# Patient Record
Sex: Male | Born: 1935 | Race: White | Hispanic: No | Marital: Married
Health system: Southern US, Community
[De-identification: ages and names within clinical notes are randomized; demographics above are authoritative.]

## PROBLEM LIST (undated history)

## (undated) DIAGNOSIS — A419 Sepsis, unspecified organism: Secondary | ICD-10-CM

## (undated) DIAGNOSIS — K7030 Alcoholic cirrhosis of liver without ascites: Secondary | ICD-10-CM

## (undated) DIAGNOSIS — J9621 Acute and chronic respiratory failure with hypoxia: Secondary | ICD-10-CM

## (undated) DIAGNOSIS — E2740 Unspecified adrenocortical insufficiency: Secondary | ICD-10-CM

## (undated) DIAGNOSIS — F0390 Unspecified dementia without behavioral disturbance: Secondary | ICD-10-CM

## (undated) DIAGNOSIS — J90 Pleural effusion, not elsewhere classified: Secondary | ICD-10-CM

## (undated) DIAGNOSIS — R6520 Severe sepsis without septic shock: Secondary | ICD-10-CM

---

## 2018-08-09 DEATH — deceased

## 2019-07-02 ENCOUNTER — Other Ambulatory Visit (HOSPITAL_COMMUNITY): Payer: Medicare Other

## 2019-07-02 ENCOUNTER — Inpatient Hospital Stay
Admission: AD | Admit: 2019-07-02 | Discharge: 2019-08-10 | Disposition: E | Payer: Medicare Other | Source: Other Acute Inpatient Hospital | Attending: Internal Medicine | Admitting: Internal Medicine

## 2019-07-02 DIAGNOSIS — K7030 Alcoholic cirrhosis of liver without ascites: Secondary | ICD-10-CM | POA: Diagnosis present

## 2019-07-02 DIAGNOSIS — J9621 Acute and chronic respiratory failure with hypoxia: Secondary | ICD-10-CM | POA: Diagnosis present

## 2019-07-02 DIAGNOSIS — R188 Other ascites: Secondary | ICD-10-CM

## 2019-07-02 DIAGNOSIS — F0390 Unspecified dementia without behavioral disturbance: Secondary | ICD-10-CM | POA: Diagnosis present

## 2019-07-02 DIAGNOSIS — E2740 Unspecified adrenocortical insufficiency: Secondary | ICD-10-CM | POA: Diagnosis present

## 2019-07-02 DIAGNOSIS — R34 Anuria and oliguria: Secondary | ICD-10-CM

## 2019-07-02 DIAGNOSIS — J189 Pneumonia, unspecified organism: Secondary | ICD-10-CM

## 2019-07-02 DIAGNOSIS — J111 Influenza due to unidentified influenza virus with other respiratory manifestations: Secondary | ICD-10-CM

## 2019-07-02 DIAGNOSIS — Z9289 Personal history of other medical treatment: Secondary | ICD-10-CM

## 2019-07-02 DIAGNOSIS — R0603 Acute respiratory distress: Secondary | ICD-10-CM

## 2019-07-02 DIAGNOSIS — Z0189 Encounter for other specified special examinations: Secondary | ICD-10-CM

## 2019-07-02 DIAGNOSIS — Z9911 Dependence on respirator [ventilator] status: Secondary | ICD-10-CM

## 2019-07-02 DIAGNOSIS — J9690 Respiratory failure, unspecified, unspecified whether with hypoxia or hypercapnia: Secondary | ICD-10-CM

## 2019-07-02 DIAGNOSIS — A419 Sepsis, unspecified organism: Secondary | ICD-10-CM | POA: Diagnosis present

## 2019-07-02 DIAGNOSIS — J90 Pleural effusion, not elsewhere classified: Secondary | ICD-10-CM | POA: Diagnosis present

## 2019-07-02 DIAGNOSIS — Z4659 Encounter for fitting and adjustment of other gastrointestinal appliance and device: Secondary | ICD-10-CM

## 2019-07-02 DIAGNOSIS — J9811 Atelectasis: Secondary | ICD-10-CM

## 2019-07-02 DIAGNOSIS — K7460 Unspecified cirrhosis of liver: Secondary | ICD-10-CM

## 2019-07-02 HISTORY — DX: Alcoholic cirrhosis of liver without ascites: K70.30

## 2019-07-02 HISTORY — DX: Pleural effusion, not elsewhere classified: J90

## 2019-07-02 HISTORY — DX: Unspecified adrenocortical insufficiency: E27.40

## 2019-07-02 HISTORY — DX: Sepsis, unspecified organism: R65.20

## 2019-07-02 HISTORY — DX: Acute and chronic respiratory failure with hypoxia: J96.21

## 2019-07-02 HISTORY — DX: Sepsis, unspecified organism: A41.9

## 2019-07-02 HISTORY — DX: Unspecified dementia, unspecified severity, without behavioral disturbance, psychotic disturbance, mood disturbance, and anxiety: F03.90

## 2019-07-02 LAB — BLOOD GAS, ARTERIAL
Acid-Base Excess: 4.1 mmol/L — ABNORMAL HIGH (ref 0.0–2.0)
Bicarbonate: 27.4 mmol/L (ref 20.0–28.0)
FIO2: 35
O2 Saturation: 98 %
Patient temperature: 37
pCO2 arterial: 36.3 mmHg (ref 32.0–48.0)
pH, Arterial: 7.49 — ABNORMAL HIGH (ref 7.350–7.450)
pO2, Arterial: 98.3 mmHg (ref 83.0–108.0)

## 2019-07-02 MED ORDER — IVERMECTIN 3 MG PO TABS
200.00 | ORAL_TABLET | ORAL | Status: DC
Start: ? — End: 2019-07-02

## 2019-07-02 MED ORDER — DEXTROSE 10 % IV SOLN
50.00 | INTRAVENOUS | Status: DC
Start: ? — End: 2019-07-02

## 2019-07-02 MED ORDER — TAMSULOSIN HCL 0.4 MG PO CAPS
0.80 | ORAL_CAPSULE | ORAL | Status: DC
Start: 2019-07-02 — End: 2019-07-02

## 2019-07-02 MED ORDER — DEXTROSE 50 % IV SOLN
50.00 | INTRAVENOUS | Status: DC
Start: ? — End: 2019-07-02

## 2019-07-02 MED ORDER — MORPHINE SULFATE 2 MG/ML IJ SOLN
2.00 | INTRAMUSCULAR | Status: DC
Start: ? — End: 2019-07-02

## 2019-07-02 MED ORDER — SENNOSIDES-DOCUSATE SODIUM 8.6-50 MG PO TABS
1.00 | ORAL_TABLET | ORAL | Status: DC
Start: 2019-07-02 — End: 2019-07-02

## 2019-07-02 MED ORDER — GENERIC EXTERNAL MEDICATION
1.00 | Status: DC
Start: 2019-07-02 — End: 2019-07-02

## 2019-07-02 MED ORDER — COCONUT OIL OIL
1.00 | TOPICAL_OIL | Status: DC
Start: 2019-07-02 — End: 2019-07-02

## 2019-07-02 MED ORDER — ASPIRIN 81 MG PO CHEW
81.00 | CHEWABLE_TABLET | ORAL | Status: DC
Start: 2019-07-03 — End: 2019-07-02

## 2019-07-02 MED ORDER — HYDROCORTISONE NA SUCCINATE PF 100 MG IJ SOLR
100.00 | INTRAMUSCULAR | Status: DC
Start: 2019-07-02 — End: 2019-07-02

## 2019-07-02 MED ORDER — ALBUTEROL SULFATE (2.5 MG/3ML) 0.083% IN NEBU
2.50 | INHALATION_SOLUTION | RESPIRATORY_TRACT | Status: DC
Start: ? — End: 2019-07-02

## 2019-07-02 MED ORDER — ACETYLCYSTEINE 20 % IN SOLN
600.00 | RESPIRATORY_TRACT | Status: DC
Start: 2019-07-02 — End: 2019-07-02

## 2019-07-02 MED ORDER — LACTULOSE 10 GM/15ML PO SOLN
20.00 | ORAL | Status: DC
Start: 2019-07-02 — End: 2019-07-02

## 2019-07-02 MED ORDER — PANTOPRAZOLE SODIUM 40 MG IV SOLR
40.00 | INTRAVENOUS | Status: DC
Start: 2019-07-03 — End: 2019-07-02

## 2019-07-03 DIAGNOSIS — K7030 Alcoholic cirrhosis of liver without ascites: Secondary | ICD-10-CM | POA: Diagnosis not present

## 2019-07-03 DIAGNOSIS — E2740 Unspecified adrenocortical insufficiency: Secondary | ICD-10-CM

## 2019-07-03 DIAGNOSIS — R6520 Severe sepsis without septic shock: Secondary | ICD-10-CM

## 2019-07-03 DIAGNOSIS — Z9911 Dependence on respirator [ventilator] status: Secondary | ICD-10-CM

## 2019-07-03 DIAGNOSIS — A419 Sepsis, unspecified organism: Secondary | ICD-10-CM

## 2019-07-03 DIAGNOSIS — F0390 Unspecified dementia without behavioral disturbance: Secondary | ICD-10-CM

## 2019-07-03 DIAGNOSIS — J90 Pleural effusion, not elsewhere classified: Secondary | ICD-10-CM | POA: Diagnosis not present

## 2019-07-03 DIAGNOSIS — J9621 Acute and chronic respiratory failure with hypoxia: Secondary | ICD-10-CM | POA: Diagnosis not present

## 2019-07-03 LAB — CBC WITH DIFFERENTIAL/PLATELET
Abs Immature Granulocytes: 0.12 10*3/uL — ABNORMAL HIGH (ref 0.00–0.07)
Basophils Absolute: 0 10*3/uL (ref 0.0–0.1)
Basophils Relative: 0 %
Eosinophils Absolute: 0.1 10*3/uL (ref 0.0–0.5)
Eosinophils Relative: 2 %
HCT: 33.6 % — ABNORMAL LOW (ref 39.0–52.0)
Hemoglobin: 10.8 g/dL — ABNORMAL LOW (ref 13.0–17.0)
Immature Granulocytes: 2 %
Lymphocytes Relative: 9 %
Lymphs Abs: 0.6 10*3/uL — ABNORMAL LOW (ref 0.7–4.0)
MCH: 30.9 pg (ref 26.0–34.0)
MCHC: 32.1 g/dL (ref 30.0–36.0)
MCV: 96 fL (ref 80.0–100.0)
Monocytes Absolute: 0.4 10*3/uL (ref 0.1–1.0)
Monocytes Relative: 6 %
Neutro Abs: 5.7 10*3/uL (ref 1.7–7.7)
Neutrophils Relative %: 81 %
Platelets: 193 10*3/uL (ref 150–400)
RBC: 3.5 MIL/uL — ABNORMAL LOW (ref 4.22–5.81)
RDW: 17.4 % — ABNORMAL HIGH (ref 11.5–15.5)
WBC: 7 10*3/uL (ref 4.0–10.5)
nRBC: 0 % (ref 0.0–0.2)

## 2019-07-03 LAB — HEMOGLOBIN A1C
Hgb A1c MFr Bld: 4.5 % — ABNORMAL LOW (ref 4.8–5.6)
Mean Plasma Glucose: 82.45 mg/dL

## 2019-07-03 LAB — URINALYSIS, ROUTINE W REFLEX MICROSCOPIC
Bilirubin Urine: NEGATIVE
Glucose, UA: NEGATIVE mg/dL
Ketones, ur: NEGATIVE mg/dL
Nitrite: NEGATIVE
Protein, ur: 100 mg/dL — AB
Specific Gravity, Urine: 1.024 (ref 1.005–1.030)
pH: 5 (ref 5.0–8.0)

## 2019-07-03 LAB — COMPREHENSIVE METABOLIC PANEL
ALT: 100 U/L — ABNORMAL HIGH (ref 0–44)
AST: 78 U/L — ABNORMAL HIGH (ref 15–41)
Albumin: 1.8 g/dL — ABNORMAL LOW (ref 3.5–5.0)
Alkaline Phosphatase: 125 U/L (ref 38–126)
Anion gap: 8 (ref 5–15)
BUN: 28 mg/dL — ABNORMAL HIGH (ref 8–23)
CO2: 28 mmol/L (ref 22–32)
Calcium: 8 mg/dL — ABNORMAL LOW (ref 8.9–10.3)
Chloride: 113 mmol/L — ABNORMAL HIGH (ref 98–111)
Creatinine, Ser: 0.89 mg/dL (ref 0.61–1.24)
GFR calc Af Amer: >60 mL/min (ref >60)
GFR calc non Af Amer: >60 mL/min (ref >60)
Glucose, Bld: 146 mg/dL — ABNORMAL HIGH (ref 70–99)
Potassium: 3.8 mmol/L (ref 3.5–5.1)
Sodium: 149 mmol/L — ABNORMAL HIGH (ref 135–145)
Total Bilirubin: 1 mg/dL (ref 0.3–1.2)
Total Protein: 4.2 g/dL — ABNORMAL LOW (ref 6.5–8.1)

## 2019-07-03 LAB — PHOSPHORUS: Phosphorus: 2.1 mg/dL — ABNORMAL LOW (ref 2.5–4.6)

## 2019-07-03 LAB — PROTIME-INR
INR: 1.2 (ref 0.8–1.2)
Prothrombin Time: 15.4 seconds — ABNORMAL HIGH (ref 11.4–15.2)

## 2019-07-03 LAB — TSH: TSH: 4.561 u[IU]/mL — ABNORMAL HIGH (ref 0.350–4.500)

## 2019-07-03 LAB — MAGNESIUM: Magnesium: 2 mg/dL (ref 1.7–2.4)

## 2019-07-03 NOTE — Consult Note (Signed)
Pulmonary Critical Care Medicine [facility]  PULMONARY SERVICE  Date of Service: 07/03/2019  PULMONARY CRITICAL CARE CONSULT   Joel Ruiz  CVE:[id number]  DOB: 03-27-1932   DOA: 06/13/2019  Referring Physician: Merton Border, MD  HPI: Joel Ruiz is a 85 y.o. male seen for follow up of Acute on Chronic Respiratory Failure.  Patient has multiple medical problems presented to the hospital with history of myocardial infarction peripheral vascular disease hyperlipidemia hypertension came into the hospital because of increasing shortness of breath.  Apparently was noted to have saturations in the 80s and generalized weakness.  Patient apparently had not been seen by a physician in quite some time.  Was found to be septic patient apparently also had pleural effusion and underwent thoracentesis which was negative for empyema but patient did have a lower lobe pneumonia and was treated with aggressive antibiotics.  Apparently had skin biopsy for possibility of scabies.  Subsequently after antibiotics were given the patient became afebrile however had other complications included development of heart block and was given dopamine for the low heart rate.  Mental status changes were noted for which the patient was evaluated.  Subsequently transferred to our facility for further management.  It should be also noted that he had a Cortrosyn stim test done which was suggestive of adrenal insufficiency.  This is likely why he continues to hypothermia according to the notes.  Review of Systems:  ROS performed and is unremarkable other than noted above.  Medical Hx: Past Medical History:  Diagnosis Date  . Hyperlipidemia  . Hypertension   Surgical Hx: Past Surgical History:  Procedure Laterality Date  . HERNIA REPAIR  Cholecystectomy for cholelithiasis and common bile duct stone  Allergies:  Ativan [lorazepam]  Social Hx:  Tobacco use: reports that he has never  smoked. He has never used smokeless tobacco. Alcohol use: Reports using 6 ounces (3 shots) of 80 proof liquor and a drink nightly.  Drug use: reports no history of drug use.  Lives at home with his daughter, Garald Balding.  Family History: Family History  Problem Relation Age of Onset  . Hyperlipidemia Mother    Medications: Reviewed on Rounds  Physical Exam:  Vitals: Temperature 97.0 pulse 70 respiratory 16 blood pressure 174/77 saturations 100%  Ventilator Settings mode of ventilation assist control FiO2 35% tidal volume 450 PEEP 5  . General: Comfortable at this time . Eyes: Grossly normal lids, irises & conjunctiva . ENT: grossly tongue is normal . Neck: no obvious mass . Cardiovascular: S1-S2 normal no gallop or rub . Respiratory: No rhonchi no rales are noted at this time . Abdomen: Soft nontender . Skin: no rash seen on limited exam . Musculoskeletal: not rigid . Psychiatric:unable to assess . Neurologic: no seizure no involuntary movements         Labs on Admission:  Basic Metabolic Panel: Recent Labs  Lab 07/03/19 0358  NA 149*  K 3.8  CL 113*  CO2 28  GLUCOSE 146*  BUN 28*  CREATININE 0.89  CALCIUM 8.0*  MG 2.0  PHOS 2.1*    Recent Labs  Lab 06/09/2019 1625  PHART 7.490*  PCO2ART 36.3  PO2ART 98.3  HCO3 27.4  O2SAT 98.0    Liver Function Tests: Recent Labs  Lab 07/03/19 0358  AST 78*  ALT 100*  ALKPHOS 125  BILITOT 1.0  PROT 4.2*  ALBUMIN 1.8*   No results for input(s): LIPASE, AMYLASE in the last 168 hours. No results for  input(s): AMMONIA in the last 168 hours.  CBC: Recent Labs  Lab 07/03/19 0358  WBC 7.0  NEUTROABS 5.7  HGB 10.8*  HCT 33.6*  MCV 96.0  PLT 193    Cardiac Enzymes: No results for input(s): CKTOTAL, CKMB, CKMBINDEX, TROPONINI in the last 168 hours.  BNP (last 3 results) No results for input(s): BNP in the last 8760 hours.  ProBNP (last 3 results) No results for input(s): PROBNP in the last 8760  hours.   Radiological Exams on Admission: DG Abd 1 View  Result Date: 06/24/2019 CLINICAL DATA:  Orogastric tube placement. EXAM: ABDOMEN - 1 VIEW COMPARISON:  None. FINDINGS: 1639 hours. An enteric tube projects below the diaphragm, tip in the left epigastric region, consistent with position in the mid stomach. The visualized bowel gas pattern is normal. Telemetry leads overlie the chest and abdomen. The lower chest findings are dictated separately. IMPRESSION: Enteric tube tip in the mid stomach. No acute abdominal findings demonstrated on this single supine view. Electronically Signed   By: Carey Bullocks M.D.   On: 06/14/2019 17:18   DG Chest Port 1 View  Result Date: 07/06/2019 CLINICAL DATA:  Ventilator dependent. Intubated. EXAM: PORTABLE CHEST 1 VIEW COMPARISON:  None. FINDINGS: 1638 hours. Endotracheal tube tip is in the mid trachea. A left IJ central venous catheter projects to the level the superior cavoatrial junction. Enteric tube projects below the diaphragm. There are moderate bilateral pleural effusions with associated bibasilar pulmonary opacities. No edema or pneumothorax. The heart size and mediastinal contours are normal. There is mild aortic atherosclerosis. No osseous abnormalities are seen. IMPRESSION: 1. Satisfactory position of the support system. 2. Bilateral pleural effusions with bibasilar pulmonary opacities consistent with atelectasis or infiltrates. Electronically Signed   By: Carey Bullocks M.D.   On: 06/22/2019 17:19    Assessment/Plan Active Problems:   Acute on chronic respiratory failure with hypoxia (HCC)   Severe sepsis (HCC)   Alcoholic cirrhosis of liver (HCC)   Adrenal insufficiency (HCC)   Dementia (HCC)   Bilateral pleural effusion   1. Acute on chronic respiratory failure with hypoxia patient remains orally intubated on the ventilator at this time.  Patient is on pressure support goal is 2 hours we will continue to build on advancing on the wean  pressure support mode so far he looks like he is doing fairly well. 2. Severe sepsis patient was thought to have bilateral pneumonia which was responsible for his sepsis picture treated with antibiotics apparently is on meropenem for this 3. Adrenal insufficiency at the other facility had a Cortrosyn stim test which showed borderline adrenal insufficiency plan is going to be to monitor closely. 4. Dementia no change we will monitor for any behavioral issues 5. Alcoholic cirrhosis by history we will continue with supportive care no active withdrawal 6. Pleural effusion patient treated at the other facility for complicated pleural effusion not an empyema we will continue to monitor closely.  I have personally seen and evaluated the patient, evaluated laboratory and imaging results, formulated the assessment and plan and placed orders. The Patient requires high complexity decision making for assessment and support.  Case was discussed on Rounds with the Respiratory Therapy Staff Time Spent  Yevonne Pax, MD [facility] Pulmonary Critical Care Medicine Sleep Medicine

## 2019-07-04 ENCOUNTER — Encounter: Payer: Self-pay | Admitting: Internal Medicine

## 2019-07-04 ENCOUNTER — Other Ambulatory Visit (HOSPITAL_COMMUNITY): Payer: Medicare Other

## 2019-07-04 DIAGNOSIS — E2740 Unspecified adrenocortical insufficiency: Secondary | ICD-10-CM | POA: Diagnosis not present

## 2019-07-04 DIAGNOSIS — J90 Pleural effusion, not elsewhere classified: Secondary | ICD-10-CM | POA: Diagnosis not present

## 2019-07-04 DIAGNOSIS — A419 Sepsis, unspecified organism: Secondary | ICD-10-CM | POA: Diagnosis present

## 2019-07-04 DIAGNOSIS — J9621 Acute and chronic respiratory failure with hypoxia: Secondary | ICD-10-CM | POA: Diagnosis not present

## 2019-07-04 DIAGNOSIS — K7030 Alcoholic cirrhosis of liver without ascites: Secondary | ICD-10-CM | POA: Diagnosis not present

## 2019-07-04 DIAGNOSIS — F0390 Unspecified dementia without behavioral disturbance: Secondary | ICD-10-CM | POA: Diagnosis present

## 2019-07-04 LAB — URINE CULTURE: Culture: 30000 — AB

## 2019-07-04 LAB — RENAL FUNCTION PANEL
Albumin: 1.7 g/dL — ABNORMAL LOW (ref 3.5–5.0)
Anion gap: 8 (ref 5–15)
BUN: 29 mg/dL — ABNORMAL HIGH (ref 8–23)
CO2: 24 mmol/L (ref 22–32)
Calcium: 7.8 mg/dL — ABNORMAL LOW (ref 8.9–10.3)
Chloride: 111 mmol/L (ref 98–111)
Creatinine, Ser: 0.78 mg/dL (ref 0.61–1.24)
GFR calc Af Amer: >60 mL/min (ref >60)
GFR calc non Af Amer: >60 mL/min (ref >60)
Glucose, Bld: 97 mg/dL (ref 70–99)
Phosphorus: 2.3 mg/dL — ABNORMAL LOW (ref 2.5–4.6)
Potassium: 4.4 mmol/L (ref 3.5–5.1)
Sodium: 143 mmol/L (ref 135–145)

## 2019-07-04 LAB — BLOOD GAS, ARTERIAL
Acid-Base Excess: 5.4 mmol/L — ABNORMAL HIGH (ref 0.0–2.0)
Bicarbonate: 28.6 mmol/L — ABNORMAL HIGH (ref 20.0–28.0)
FIO2: 28
O2 Saturation: 98.1 %
Patient temperature: 37
pCO2 arterial: 35.3 mmHg (ref 32.0–48.0)
pH, Arterial: 7.518 — ABNORMAL HIGH (ref 7.350–7.450)
pO2, Arterial: 97 mmHg (ref 83.0–108.0)

## 2019-07-04 LAB — CBC
HCT: 34.4 % — ABNORMAL LOW (ref 39.0–52.0)
Hemoglobin: 11.2 g/dL — ABNORMAL LOW (ref 13.0–17.0)
MCH: 31.2 pg (ref 26.0–34.0)
MCHC: 32.6 g/dL (ref 30.0–36.0)
MCV: 95.8 fL (ref 80.0–100.0)
Platelets: 303 10*3/uL (ref 150–400)
RBC: 3.59 MIL/uL — ABNORMAL LOW (ref 4.22–5.81)
RDW: 17.9 % — ABNORMAL HIGH (ref 11.5–15.5)
WBC: 9.7 10*3/uL (ref 4.0–10.5)
nRBC: 0 % (ref 0.0–0.2)

## 2019-07-04 LAB — MAGNESIUM: Magnesium: 2.1 mg/dL (ref 1.7–2.4)

## 2019-07-04 NOTE — Progress Notes (Signed)
Pulmonary Critical Care Medicine [facility] [place]   PULMONARY CRITICAL CARE SERVICE  PROGRESS NOTE  Date of Service: 07/04/2019  Joel Ruiz  ZHY:[id number]  DOB: 11-22-1931   DOA: 06/15/2019  Referring Physician: Carron Curie, MD  HPI: Joel Ruiz is a 85 y.o. male seen for follow up of Acute on Chronic Respiratory Failure.  Patient is weaning on pressure support right now is on 28% FiO2 with a pressure support of 12/5 the goal today is for 8 hours so far patient looks like he is tolerating it well  Medications: Reviewed on Rounds  Physical Exam:  Vitals: Temperature 97.1 pulse 88 respiratory 17 blood pressure 140/67 saturations 99%  Ventilator Settings mode ventilation pressure support FiO2 is 28% tidal volume 500 pressure poor 12 PEEP 5  . General: Comfortable at this time . Eyes: Grossly normal lids, irises & conjunctiva . ENT: grossly tongue is normal . Neck: no obvious mass . Cardiovascular: S1 S2 normal no gallop . Respiratory: No rhonchi coarse breath sounds are noted at this time . Abdomen: soft . Skin: no rash seen on limited exam . Musculoskeletal: not rigid . Psychiatric:unable to assess . Neurologic: no seizure no involuntary movements         Lab Data:   Basic Metabolic Panel: Recent Labs  Lab 07/03/19 0358 07/04/19 0910  NA 149* 143  K 3.8 4.4  CL 113* 111  CO2 28 24  GLUCOSE 146* 97  BUN 28* 29*  CREATININE 0.89 0.78  CALCIUM 8.0* 7.8*  MG 2.0 2.1  PHOS 2.1* 2.3*    ABG: Recent Labs  Lab 06/10/2019 1625 07/04/19 0548  PHART 7.490* 7.518*  PCO2ART 36.3 35.3  PO2ART 98.3 97.0  HCO3 27.4 28.6*  O2SAT 98.0 98.1    Liver Function Tests: Recent Labs  Lab 07/03/19 0358 07/04/19 0910  AST 78*  --   ALT 100*  --   ALKPHOS 125  --   BILITOT 1.0  --   PROT 4.2*  --   ALBUMIN 1.8* 1.7*   No results for input(s): LIPASE, AMYLASE in the last 168 hours. No results for input(s): AMMONIA in the last 168  hours.  CBC: Recent Labs  Lab 07/03/19 0358 07/04/19 0910  WBC 7.0 9.7  NEUTROABS 5.7  --   HGB 10.8* 11.2*  HCT 33.6* 34.4*  MCV 96.0 95.8  PLT 193 303    Cardiac Enzymes: No results for input(s): CKTOTAL, CKMB, CKMBINDEX, TROPONINI in the last 168 hours.  BNP (last 3 results) No results for input(s): BNP in the last 8760 hours.  ProBNP (last 3 results) No results for input(s): PROBNP in the last 8760 hours.  Radiological Exams: DG Abd 1 View  Result Date: 07/03/2019 CLINICAL DATA:  Orogastric tube placement. EXAM: ABDOMEN - 1 VIEW COMPARISON:  None. FINDINGS: 1639 hours. An enteric tube projects below the diaphragm, tip in the left epigastric region, consistent with position in the mid stomach. The visualized bowel gas pattern is normal. Telemetry leads overlie the chest and abdomen. The lower chest findings are dictated separately. IMPRESSION: Enteric tube tip in the mid stomach. No acute abdominal findings demonstrated on this single supine view. Electronically Signed   By: Carey Bullocks M.D.   On: 06/11/2019 17:18   DG Chest Port 1 View  Result Date: 06/25/2019 CLINICAL DATA:  Ventilator dependent. Intubated. EXAM: PORTABLE CHEST 1 VIEW COMPARISON:  None. FINDINGS: 1638 hours. Endotracheal tube tip is in the mid trachea. A left IJ central venous  catheter projects to the level the superior cavoatrial junction. Enteric tube projects below the diaphragm. There are moderate bilateral pleural effusions with associated bibasilar pulmonary opacities. No edema or pneumothorax. The heart size and mediastinal contours are normal. There is mild aortic atherosclerosis. No osseous abnormalities are seen. IMPRESSION: 1. Satisfactory position of the support system. 2. Bilateral pleural effusions with bibasilar pulmonary opacities consistent with atelectasis or infiltrates. Electronically Signed   By: Richardean Sale M.D.   On: 07/13/2019 17:19    Assessment/Plan Active Problems:   Acute  on chronic respiratory failure with hypoxia (HCC)   Severe sepsis (HCC)   Alcoholic cirrhosis of liver (HCC)   Adrenal insufficiency (HCC)   Dementia (HCC)   Bilateral pleural effusion   1. Acute on chronic respiratory failure with hypoxia plan is to continue to wean on pressure support mode as tolerated the goal today as already mentioned is about 8 hours 2. Severe sepsis resolved hemodynamics are stable patient has been treated with antibiotics 3. Alcoholic cirrhosis supportive care we will continue with supportive care. 4. Bilateral pleural effusions and bilateral pneumonia patient was treated at the other facility for pneumonia 5. Adrenal insufficiency supportive care monitor pressures and temperatures 6. Dementia patient is at baseline we will monitor   I have personally seen and evaluated the patient, evaluated laboratory and imaging results, formulated the assessment and plan and placed orders. The Patient requires high complexity decision making for assessment and support.  Case was discussed on Rounds with the Respiratory Therapy Staff  [name], MD [facility] Pulmonary Critical Care Medicine Sleep Medicine

## 2019-07-05 ENCOUNTER — Other Ambulatory Visit (HOSPITAL_COMMUNITY): Payer: Medicare Other

## 2019-07-05 DIAGNOSIS — J90 Pleural effusion, not elsewhere classified: Secondary | ICD-10-CM | POA: Diagnosis not present

## 2019-07-05 DIAGNOSIS — E2740 Unspecified adrenocortical insufficiency: Secondary | ICD-10-CM | POA: Diagnosis not present

## 2019-07-05 DIAGNOSIS — K7030 Alcoholic cirrhosis of liver without ascites: Secondary | ICD-10-CM | POA: Diagnosis not present

## 2019-07-05 DIAGNOSIS — J9621 Acute and chronic respiratory failure with hypoxia: Secondary | ICD-10-CM | POA: Diagnosis not present

## 2019-07-05 LAB — COMPREHENSIVE METABOLIC PANEL
ALT: 111 U/L — ABNORMAL HIGH (ref 0–44)
AST: 123 U/L — ABNORMAL HIGH (ref 15–41)
Albumin: 1.6 g/dL — ABNORMAL LOW (ref 3.5–5.0)
Alkaline Phosphatase: 168 U/L — ABNORMAL HIGH (ref 38–126)
Anion gap: 7 (ref 5–15)
BUN: 31 mg/dL — ABNORMAL HIGH (ref 8–23)
CO2: 28 mmol/L (ref 22–32)
Calcium: 7.7 mg/dL — ABNORMAL LOW (ref 8.9–10.3)
Chloride: 111 mmol/L (ref 98–111)
Creatinine, Ser: 0.83 mg/dL (ref 0.61–1.24)
GFR calc Af Amer: >60 mL/min (ref >60)
GFR calc non Af Amer: >60 mL/min (ref >60)
Glucose, Bld: 109 mg/dL — ABNORMAL HIGH (ref 70–99)
Potassium: 3.6 mmol/L (ref 3.5–5.1)
Sodium: 146 mmol/L — ABNORMAL HIGH (ref 135–145)
Total Bilirubin: 0.8 mg/dL (ref 0.3–1.2)
Total Protein: 4.1 g/dL — ABNORMAL LOW (ref 6.5–8.1)

## 2019-07-05 LAB — BLOOD GAS, ARTERIAL
Acid-Base Excess: 6.3 mmol/L — ABNORMAL HIGH (ref 0.0–2.0)
Bicarbonate: 29.7 mmol/L — ABNORMAL HIGH (ref 20.0–28.0)
FIO2: 28
O2 Saturation: 97.4 %
Patient temperature: 37
pCO2 arterial: 38.3 mmHg (ref 32.0–48.0)
pH, Arterial: 7.502 — ABNORMAL HIGH (ref 7.350–7.450)
pO2, Arterial: 85.1 mmHg (ref 83.0–108.0)

## 2019-07-05 LAB — CBC
HCT: 34 % — ABNORMAL LOW (ref 39.0–52.0)
Hemoglobin: 10.8 g/dL — ABNORMAL LOW (ref 13.0–17.0)
MCH: 30.9 pg (ref 26.0–34.0)
MCHC: 31.8 g/dL (ref 30.0–36.0)
MCV: 97.4 fL (ref 80.0–100.0)
Platelets: 347 10*3/uL (ref 150–400)
RBC: 3.49 MIL/uL — ABNORMAL LOW (ref 4.22–5.81)
RDW: 18.3 % — ABNORMAL HIGH (ref 11.5–15.5)
WBC: 9 10*3/uL (ref 4.0–10.5)
nRBC: 0 % (ref 0.0–0.2)

## 2019-07-05 LAB — CULTURE, RESPIRATORY W GRAM STAIN: Culture: NORMAL

## 2019-07-05 LAB — MAGNESIUM: Magnesium: 2.2 mg/dL (ref 1.7–2.4)

## 2019-07-05 LAB — PHOSPHORUS: Phosphorus: 2.6 mg/dL (ref 2.5–4.6)

## 2019-07-05 LAB — AMMONIA: Ammonia: 22 umol/L (ref 9–35)

## 2019-07-05 NOTE — Progress Notes (Signed)
Pulmonary Critical Care Medicine [facility] [place]   PULMONARY CRITICAL CARE SERVICE  PROGRESS NOTE  Date of Service: 07/05/2019  Joel Ruiz  XLK:[id number]  DOB: [birth year]   DOA: 06/11/2019  Referring Physician: Carron Curie, MD  HPI: Joel Ruiz is a 85 y.o. male seen for follow up of Acute on Chronic Respiratory Failure.  Patient currently is on pressure support mode has been on 28% FiO2 goal is for 12 hours  Medications: Reviewed on Rounds  Physical Exam:  Vitals: Temperature 97.7 pulse 98 respiratory rate 23 blood pressure is 153/69 saturations 96%  Ventilator Settings mode of ventilation pressure support FiO2 28% pressure 12 PEEP 5 tidal line 350  . General: Comfortable at this time . Eyes: Grossly normal lids, irises & conjunctiva . ENT: grossly tongue is normal . Neck: no obvious mass . Cardiovascular: S1 S2 normal no gallop . Respiratory: No rhonchi coarse breath sounds are noted . Abdomen: soft . Skin: no rash seen on limited exam . Musculoskeletal: not rigid . Psychiatric:unable to assess . Neurologic: no seizure no involuntary movements         Lab Data:   Basic Metabolic Panel: Recent Labs  Lab 07/03/19 0358 07/04/19 0910 07/05/19 0443  NA 149* 143 146*  K 3.8 4.4 3.6  CL 113* 111 111  CO2 28 24 28   GLUCOSE 146* 97 109*  BUN 28* 29* 31*  CREATININE 0.89 0.78 0.83  CALCIUM 8.0* 7.8* 7.7*  MG 2.0 2.1 2.2  PHOS 2.1* 2.3* 2.6    ABG: Recent Labs  Lab 06/21/2019 1625 07/04/19 0548 07/05/19 0520  PHART 7.490* 7.518* 7.502*  PCO2ART 36.3 35.3 38.3  PO2ART 98.3 97.0 85.1  HCO3 27.4 28.6* 29.7*  O2SAT 98.0 98.1 97.4    Liver Function Tests: Recent Labs  Lab 07/03/19 0358 07/04/19 0910 07/05/19 0443  AST 78*  --  123*  ALT 100*  --  111*  ALKPHOS 125  --  168*  BILITOT 1.0  --  0.8  PROT 4.2*  --  4.1*  ALBUMIN 1.8* 1.7* 1.6*   No results for input(s): LIPASE, AMYLASE in the last 168 hours. Recent  Labs  Lab 07/05/19 0443  AMMONIA 22    CBC: Recent Labs  Lab 07/03/19 0358 07/04/19 0910 07/05/19 0443  WBC 7.0 9.7 9.0  NEUTROABS 5.7  --   --   HGB 10.8* 11.2* 10.8*  HCT 33.6* 34.4* 34.0*  MCV 96.0 95.8 97.4  PLT 193 303 347    Cardiac Enzymes: No results for input(s): CKTOTAL, CKMB, CKMBINDEX, TROPONINI in the last 168 hours.  BNP (last 3 results) No results for input(s): BNP in the last 8760 hours.  ProBNP (last 3 results) No results for input(s): PROBNP in the last 8760 hours.  Radiological Exams: DG CHEST PORT 1 VIEW  Result Date: 07/05/2019 CLINICAL DATA:  Ventilator dependent respiratory failure. EXAM: PORTABLE CHEST 1 VIEW COMPARISON:  06/25/2019 FINDINGS: Endotracheal tube tip approximately 1.5 cm above the carina. Central line positioning stable. Gastric decompression tube extends below the diaphragm. The heart size is stable. Lungs demonstrate stable bilateral lower lobe atelectasis/consolidation with probable component of pleural effusions. Volume of effusions is difficult to estimate on the portable semi-erect film. No pneumothorax. IMPRESSION: 1. Stable bilateral lower lobe atelectasis/consolidation with probable component of pleural effusions. 2. Stable appearance of support lines and tubes. 3. No pneumothorax. Electronically Signed   By: 07/04/2019 M.D.   On: 07/05/2019 10:34   07/07/2019 Abdomen Limited RUQ  Result Date: 07/04/2019 CLINICAL DATA:  85 year old male with cirrhosis and increased LFT. EXAM: ULTRASOUND ABDOMEN LIMITED RIGHT UPPER QUADRANT COMPARISON:  Abdominal radiograph dated 06/20/2019. FINDINGS: Gallbladder: Not visualized. Common bile duct: Diameter: 4 mm Liver: Morphologic changes of cirrhosis. The main portal vein is suboptimally visualized but appears patent with appropriate flow direction. Other: There is a small perihepatic ascites and partially visualized small to moderate right pleural effusion. IMPRESSION: 1. Cirrhosis 2. Small ascites  and small to moderate right pleural effusion. Electronically Signed   By: Anner Crete M.D.   On: 07/04/2019 20:29    Assessment/Plan Active Problems:   Acute on chronic respiratory failure with hypoxia (HCC)   Severe sepsis (HCC)   Alcoholic cirrhosis of liver (HCC)   Adrenal insufficiency (HCC)   Dementia (HCC)   Bilateral pleural effusion   1. Acute on chronic respiratory failure with hypoxia the plan is to continue with pressure support weaning as tolerated the goal today is for 12 hours 2. Severe sepsis hemodynamics are stable resolving 3. Alcoholic cirrhosis no change we will continue to monitor closely ultrasound done which shows small amount of ascites and pleural effusion 4. Adrenal insufficiency we will monitor labs 5. Dementia no change 6. Bilateral effusion ultrasound as above we will need to monitor reaccumulation of the effusions as they will be recurring   I have personally seen and evaluated the patient, evaluated laboratory and imaging results, formulated the assessment and plan and placed orders. The Patient requires high complexity decision making for assessment and support.  Case was discussed on Rounds with the Respiratory Therapy Staff  [name], MD [facility] Pulmonary Critical Care Medicine Sleep Medicine

## 2019-07-06 DIAGNOSIS — J9621 Acute and chronic respiratory failure with hypoxia: Secondary | ICD-10-CM | POA: Diagnosis not present

## 2019-07-06 DIAGNOSIS — K7030 Alcoholic cirrhosis of liver without ascites: Secondary | ICD-10-CM | POA: Diagnosis not present

## 2019-07-06 DIAGNOSIS — E2740 Unspecified adrenocortical insufficiency: Secondary | ICD-10-CM | POA: Diagnosis not present

## 2019-07-06 DIAGNOSIS — J90 Pleural effusion, not elsewhere classified: Secondary | ICD-10-CM | POA: Diagnosis not present

## 2019-07-06 LAB — BASIC METABOLIC PANEL
Anion gap: 5 (ref 5–15)
BUN: 33 mg/dL — ABNORMAL HIGH (ref 8–23)
CO2: 31 mmol/L (ref 22–32)
Calcium: 7.6 mg/dL — ABNORMAL LOW (ref 8.9–10.3)
Chloride: 110 mmol/L (ref 98–111)
Creatinine, Ser: 0.87 mg/dL (ref 0.61–1.24)
GFR calc Af Amer: >60 mL/min (ref >60)
GFR calc non Af Amer: >60 mL/min (ref >60)
Glucose, Bld: 117 mg/dL — ABNORMAL HIGH (ref 70–99)
Potassium: 4 mmol/L (ref 3.5–5.1)
Sodium: 146 mmol/L — ABNORMAL HIGH (ref 135–145)

## 2019-07-06 LAB — PHOSPHORUS: Phosphorus: 2.4 mg/dL — ABNORMAL LOW (ref 2.5–4.6)

## 2019-07-06 LAB — CBC
HCT: 33.1 % — ABNORMAL LOW (ref 39.0–52.0)
Hemoglobin: 10.8 g/dL — ABNORMAL LOW (ref 13.0–17.0)
MCH: 31.9 pg (ref 26.0–34.0)
MCHC: 32.6 g/dL (ref 30.0–36.0)
MCV: 97.6 fL (ref 80.0–100.0)
Platelets: 357 10*3/uL (ref 150–400)
RBC: 3.39 MIL/uL — ABNORMAL LOW (ref 4.22–5.81)
RDW: 18.4 % — ABNORMAL HIGH (ref 11.5–15.5)
WBC: 9.7 10*3/uL (ref 4.0–10.5)
nRBC: 0 % (ref 0.0–0.2)

## 2019-07-06 LAB — MAGNESIUM: Magnesium: 2.5 mg/dL — ABNORMAL HIGH (ref 1.7–2.4)

## 2019-07-06 NOTE — Progress Notes (Signed)
Pulmonary Critical Care Medicine [place]   PULMONARY CRITICAL CARE SERVICE  PROGRESS NOTE  Date of Service: 07/06/2019  Joel Ruiz  ZOX:[id number]  DOB: February 17, 1932   DOA: 07/11/2019  Referring Physician: Merton Border, MD  HPI: Joel Ruiz is a 85 y.o. male seen for follow up of Acute on Chronic Respiratory Failure.  Patient is on pressure support mode remains intubated currently was on 8/5 volumes look good patient seems to be tolerating the pressure support wean fairly well  Medications: Reviewed on Rounds  Physical Exam:  Vitals: Temperature 96.7 pulse 68 respiratory 26 blood pressure 127/62 saturations 98%  Ventilator Settings mode ventilation pressure support FiO2 28% pressure support 8 PEEP 5  . General: Comfortable at this time . Eyes: Grossly normal lids, irises & conjunctiva . ENT: grossly tongue is normal . Neck: no obvious mass . Cardiovascular: S1 S2 normal no gallop . Respiratory: No rhonchi no rales are noted at this time . Abdomen: soft . Skin: no rash seen on limited exam . Musculoskeletal: not rigid . Psychiatric:unable to assess . Neurologic: no seizure no involuntary movements         Lab Data:   Basic Metabolic Panel: Recent Labs  Lab 07/03/19 0358 07/04/19 0910 07/05/19 0443 07/06/19 0624  NA 149* 143 146* 146*  K 3.8 4.4 3.6 4.0  CL 113* 111 111 110  CO2 28 24 28 31   GLUCOSE 146* 97 109* 117*  BUN 28* 29* 31* 33*  CREATININE 0.89 0.78 0.83 0.87  CALCIUM 8.0* 7.8* 7.7* 7.6*  MG 2.0 2.1 2.2 2.5*  PHOS 2.1* 2.3* 2.6 2.4*    ABG: Recent Labs  Lab 07-11-19 1625 07/04/19 0548 07/05/19 0520  PHART 7.490* 7.518* 7.502*  PCO2ART 36.3 35.3 38.3  PO2ART 98.3 97.0 85.1  HCO3 27.4 28.6* 29.7*  O2SAT 98.0 98.1 97.4    Liver Function Tests: Recent Labs  Lab 07/03/19 0358 07/04/19 0910 07/05/19 0443  AST 78*  --  123*  ALT 100*  --  111*  ALKPHOS 125  --  168*  BILITOT 1.0  --  0.8  PROT  4.2*  --  4.1*  ALBUMIN 1.8* 1.7* 1.6*   No results for input(s): LIPASE, AMYLASE in the last 168 hours. Recent Labs  Lab 07/05/19 0443  AMMONIA 22    CBC: Recent Labs  Lab 07/03/19 0358 07/04/19 0910 07/05/19 0443 07/06/19 0624  WBC 7.0 9.7 9.0 9.7  NEUTROABS 5.7  --   --   --   HGB 10.8* 11.2* 10.8* 10.8*  HCT 33.6* 34.4* 34.0* 33.1*  MCV 96.0 95.8 97.4 97.6  PLT 193 303 347 357    Cardiac Enzymes: No results for input(s): CKTOTAL, CKMB, CKMBINDEX, TROPONINI in the last 168 hours.  BNP (last 3 results) No results for input(s): BNP in the last 8760 hours.  ProBNP (last 3 results) No results for input(s): PROBNP in the last 8760 hours.  Radiological Exams: DG CHEST PORT 1 VIEW  Result Date: 07/05/2019 CLINICAL DATA:  Acute on chronic respiratory failure. EXAM: PORTABLE CHEST 1 VIEW 9:09 p.m. COMPARISON:  07/05/2019 at 9:54 a.m. and 07/11/19 FINDINGS: Endotracheal tube tip is 5 cm above the carina. NG tube tip is below the diaphragm. Central venous catheter tip is at the cavoatrial junction. Heart size and pulmonary vascularity are normal. Persistent haziness at both lung bases are consistent with pleural effusions. I suspect there is atelectasis at the left lung base is well. Compared with the prior exam,  there has been no significant change. IMPRESSION: No significant change since the prior study. Persistent bilateral pleural effusions and probable atelectasis at the left base. Electronically Signed   By: Francene Boyers M.D.   On: 07/05/2019 21:28   DG CHEST PORT 1 VIEW  Result Date: 07/05/2019 CLINICAL DATA:  Ventilator dependent respiratory failure. EXAM: PORTABLE CHEST 1 VIEW COMPARISON:  06/11/2019 FINDINGS: Endotracheal tube tip approximately 1.5 cm above the carina. Central line positioning stable. Gastric decompression tube extends below the diaphragm. The heart size is stable. Lungs demonstrate stable bilateral lower lobe atelectasis/consolidation with probable  component of pleural effusions. Volume of effusions is difficult to estimate on the portable semi-erect film. No pneumothorax. IMPRESSION: 1. Stable bilateral lower lobe atelectasis/consolidation with probable component of pleural effusions. 2. Stable appearance of support lines and tubes. 3. No pneumothorax. Electronically Signed   By: Irish Lack M.D.   On: 07/05/2019 10:34   US Abdomen Limited RUQ  Result Date: 07/04/2019 CLINICAL DATA:  85 year old male with cirrhosis and increased LFT. EXAM: ULTRASOUND ABDOMEN LIMITED RIGHT UPPER QUADRANT COMPARISON:  Abdominal radiograph dated 07/09/2019. FINDINGS: Gallbladder: Not visualized. Common bile duct: Diameter: 4 mm Liver: Morphologic changes of cirrhosis. The main portal vein is suboptimally visualized but appears patent with appropriate flow direction. Other: There is a small perihepatic ascites and partially visualized small to moderate right pleural effusion. IMPRESSION: 1. Cirrhosis 2. Small ascites and small to moderate right pleural effusion. Electronically Signed   By: Elgie Collard M.D.   On: 07/04/2019 20:29    Assessment/Plan Active Problems:   Acute on chronic respiratory failure with hypoxia (HCC)   Severe sepsis (HCC)   Alcoholic cirrhosis of liver (HCC)   Adrenal insufficiency (HCC)   Dementia (HCC)   Bilateral pleural effusion   1. Acute on chronic respiratory failure hypoxia continue with pressure support orally intubated at this time plan is to hopefully work towards extubation 2. Severe sepsis resolved hemodynamics are stable 3. Alcoholic cirrhosis no change we will continue present management 4. Adrenal insufficiency supportive care 5. Dementia no change 6. Bilateral effusion follow radiologically we will continue with supportive care   I have personally seen and evaluated the patient, evaluated laboratory and imaging results, formulated the assessment and plan and placed orders.  Time 35 minutes patient critically  ill has a high risk airway orally intubated The Patient requires high complexity decision making for assessment and support.  Case was discussed on Rounds with the Respiratory Therapy Staff  Yevonne Pax, MD [facility] Pulmonary Critical Care Medicine Sleep Medicine

## 2019-07-07 ENCOUNTER — Other Ambulatory Visit (HOSPITAL_COMMUNITY): Payer: Medicare Other

## 2019-07-07 DIAGNOSIS — J90 Pleural effusion, not elsewhere classified: Secondary | ICD-10-CM | POA: Diagnosis not present

## 2019-07-07 DIAGNOSIS — J9621 Acute and chronic respiratory failure with hypoxia: Secondary | ICD-10-CM | POA: Diagnosis not present

## 2019-07-07 DIAGNOSIS — K7030 Alcoholic cirrhosis of liver without ascites: Secondary | ICD-10-CM | POA: Diagnosis not present

## 2019-07-07 DIAGNOSIS — E2740 Unspecified adrenocortical insufficiency: Secondary | ICD-10-CM | POA: Diagnosis not present

## 2019-07-07 LAB — BASIC METABOLIC PANEL
Anion gap: 7 (ref 5–15)
BUN: 38 mg/dL — ABNORMAL HIGH (ref 8–23)
CO2: 27 mmol/L (ref 22–32)
Calcium: 7.5 mg/dL — ABNORMAL LOW (ref 8.9–10.3)
Chloride: 112 mmol/L — ABNORMAL HIGH (ref 98–111)
Creatinine, Ser: 1.01 mg/dL (ref 0.61–1.24)
GFR calc Af Amer: >60 mL/min (ref >60)
GFR calc non Af Amer: >60 mL/min (ref >60)
Glucose, Bld: 108 mg/dL — ABNORMAL HIGH (ref 70–99)
Potassium: 4 mmol/L (ref 3.5–5.1)
Sodium: 146 mmol/L — ABNORMAL HIGH (ref 135–145)

## 2019-07-07 LAB — AMMONIA: Ammonia: 28 umol/L (ref 9–35)

## 2019-07-07 NOTE — Progress Notes (Signed)
Pulmonary Critical Care Medicine [facility] [place]   PULMONARY CRITICAL CARE SERVICE  PROGRESS NOTE  Date of Service: 07/07/2019  Joel Ruiz  GBT:[id number]  DOB: Jun 12, 1932   DOA: 06/28/2019  Referring Physician: Carron Curie, MD  HPI: Joel Ruiz is a 85 y.o. male seen for follow up of Acute on Chronic Respiratory Failure.  Patient is on pressure support mode currently on 28% FiO2 with good tidal volume seems to be doing well  Medications: Reviewed on Rounds  Physical Exam:  Vitals: Temperature 96.3 pulse 64 respiratory rate 17 blood pressure is 117/47 saturations 97%  Ventilator Settings mode of ventilation pressure support FiO2 28% tidal volume 480 pressure 12 PEEP 5  . General: Comfortable at this time . Eyes: Grossly normal lids, irises & conjunctiva . ENT: grossly tongue is normal . Neck: no obvious mass . Cardiovascular: S1 S2 normal no gallop . Respiratory: No rhonchi no rales are noted at this time . Abdomen: soft . Skin: no rash seen on limited exam . Musculoskeletal: not rigid . Psychiatric:unable to assess . Neurologic: no seizure no involuntary movements         Lab Data:   Basic Metabolic Panel: Recent Labs  Lab 07/03/19 0358 07/04/19 0910 07/05/19 0443 07/06/19 0624 07/07/19 0510  NA 149* 143 146* 146* 146*  K 3.8 4.4 3.6 4.0 4.0  CL 113* 111 111 110 112*  CO2 28 24 28 31 27   GLUCOSE 146* 97 109* 117* 108*  BUN 28* 29* 31* 33* 38*  CREATININE 0.89 0.78 0.83 0.87 1.01  CALCIUM 8.0* 7.8* 7.7* 7.6* 7.5*  MG 2.0 2.1 2.2 2.5*  --   PHOS 2.1* 2.3* 2.6 2.4*  --     ABG: Recent Labs  Lab 07/01/2019 1625 07/04/19 0548 07/05/19 0520  PHART 7.490* 7.518* 7.502*  PCO2ART 36.3 35.3 38.3  PO2ART 98.3 97.0 85.1  HCO3 27.4 28.6* 29.7*  O2SAT 98.0 98.1 97.4    Liver Function Tests: Recent Labs  Lab 07/03/19 0358 07/04/19 0910 07/05/19 0443  AST 78*  --  123*  ALT 100*  --  111*  ALKPHOS 125  --  168*   BILITOT 1.0  --  0.8  PROT 4.2*  --  4.1*  ALBUMIN 1.8* 1.7* 1.6*   No results for input(s): LIPASE, AMYLASE in the last 168 hours. Recent Labs  Lab 07/05/19 0443 07/07/19 0510  AMMONIA 22 28    CBC: Recent Labs  Lab 07/03/19 0358 07/04/19 0910 07/05/19 0443 07/06/19 0624  WBC 7.0 9.7 9.0 9.7  NEUTROABS 5.7  --   --   --   HGB 10.8* 11.2* 10.8* 10.8*  HCT 33.6* 34.4* 34.0* 33.1*  MCV 96.0 95.8 97.4 97.6  PLT 193 303 347 357    Cardiac Enzymes: No results for input(s): CKTOTAL, CKMB, CKMBINDEX, TROPONINI in the last 168 hours.  BNP (last 3 results) No results for input(s): BNP in the last 8760 hours.  ProBNP (last 3 results) No results for input(s): PROBNP in the last 8760 hours.  Radiological Exams: DG CHEST PORT 1 VIEW  Result Date: 07/07/2019 CLINICAL DATA:  Ventilator dependent pneumonia. EXAM: PORTABLE CHEST 1 VIEW COMPARISON:  Chest radiograph 07/05/2019 FINDINGS: ET tube 4.9 cm above the level of the carina. NG tube passes below level of left hemidiaphragm with tip excluded from the field of view. Unchanged central venous catheter with tip projecting in the region of the cavoatrial junction. Overlying cardiac monitoring leads. Heart size within normal limits. Hazy  bibasilar opacities are similar to prior examination and consistent with pleural effusions with atelectasis and/or consolidation. No evidence of pneumothorax. No acute bony abnormality. IMPRESSION: Unchanged support apparatus. Unchanged hazy bibasilar opacities consistent with pleural effusions with atelectasis and/or consolidation. Electronically Signed   By: Kellie Simmering DO   On: 07/07/2019 07:22   DG CHEST PORT 1 VIEW  Result Date: 07/05/2019 CLINICAL DATA:  Acute on chronic respiratory failure. EXAM: PORTABLE CHEST 1 VIEW 9:09 p.m. COMPARISON:  07/05/2019 at 9:54 a.m. and 07/15/2019 FINDINGS: Endotracheal tube tip is 5 cm above the carina. NG tube tip is below the diaphragm. Central venous catheter  tip is at the cavoatrial junction. Heart size and pulmonary vascularity are normal. Persistent haziness at both lung bases are consistent with pleural effusions. I suspect there is atelectasis at the left lung base is well. Compared with the prior exam, there has been no significant change. IMPRESSION: No significant change since the prior study. Persistent bilateral pleural effusions and probable atelectasis at the left base. Electronically Signed   By: Lorriane Shire M.D.   On: 07/05/2019 21:28    Assessment/Plan Active Problems:   Acute on chronic respiratory failure with hypoxia (HCC)   Severe sepsis (HCC)   Alcoholic cirrhosis of liver (HCC)   Adrenal insufficiency (HCC)   Dementia (HCC)   Bilateral pleural effusion   1. Acute on chronic respiratory failure with hypoxia plan is to continue with pressure support mode titrate oxygen continue pulmonary toilet 2. Severe sepsis resolved hemodynamics are stable 3. Alcoholic cirrhosis stable we will continue to monitor 4. Adrenal insufficiency no change 5. Dementia he is at baseline we will continue with supportive care 6. Bilateral effusions follow radiologically   I have personally seen and evaluated the patient, evaluated laboratory and imaging results, formulated the assessment and plan and placed orders. The Patient requires high complexity decision making for assessment and support.  Case was discussed on Rounds with the Respiratory Therapy Staff  [name], MD [facility] Pulmonary Critical Care Medicine Sleep Medicine

## 2019-07-08 ENCOUNTER — Other Ambulatory Visit (HOSPITAL_COMMUNITY): Payer: Medicare Other

## 2019-07-08 DIAGNOSIS — J90 Pleural effusion, not elsewhere classified: Secondary | ICD-10-CM | POA: Diagnosis not present

## 2019-07-08 DIAGNOSIS — J9621 Acute and chronic respiratory failure with hypoxia: Secondary | ICD-10-CM | POA: Diagnosis not present

## 2019-07-08 DIAGNOSIS — K7031 Alcoholic cirrhosis of liver with ascites: Secondary | ICD-10-CM | POA: Diagnosis not present

## 2019-07-08 DIAGNOSIS — E2740 Unspecified adrenocortical insufficiency: Secondary | ICD-10-CM | POA: Diagnosis not present

## 2019-07-08 LAB — BASIC METABOLIC PANEL
Anion gap: 8 (ref 5–15)
BUN: 45 mg/dL — ABNORMAL HIGH (ref 8–23)
CO2: 27 mmol/L (ref 22–32)
Calcium: 7.7 mg/dL — ABNORMAL LOW (ref 8.9–10.3)
Chloride: 106 mmol/L (ref 98–111)
Creatinine, Ser: 1.08 mg/dL (ref 0.61–1.24)
GFR calc Af Amer: >60 mL/min (ref >60)
GFR calc non Af Amer: >60 mL/min (ref >60)
Glucose, Bld: 123 mg/dL — ABNORMAL HIGH (ref 70–99)
Potassium: 3.9 mmol/L (ref 3.5–5.1)
Sodium: 141 mmol/L (ref 135–145)

## 2019-07-08 NOTE — Progress Notes (Signed)
Pulmonary Critical Care Medicine [facility] [place]   PULMONARY CRITICAL CARE SERVICE  PROGRESS NOTE  Date of Service: 07/08/2019  Joel Ruiz  WGN:[id number]  DOB: 09/01/1931   DOA: 07/09/2019  Referring Physician: Carron Curie, MD  HPI: Joel Ruiz is a 85 y.o. male seen for follow up of Acute on Chronic Respiratory Failure.  Patient is on pressure support mood is actually doing pretty good has been on 28% FiO2 on 12/5 for more than 24 hours and now and I think that the patient could actually potentially be extubated.  If we do extubate the patient should be extubated to BiPAP.  Medications: Reviewed on Rounds  Physical Exam:  Vitals: Temperature 97.2 pulse 70 respiratory 18 blood pressure is 112/57 saturations 97%  Ventilator Settings mode ventilation pressure support FiO2 20% pressure support 12 PEEP 5 tidal volume 395 cc  . General: Comfortable at this time . Eyes: Grossly normal lids, irises & conjunctiva . ENT: grossly tongue is normal . Neck: no obvious mass . Cardiovascular: S1 S2 normal no gallop . Respiratory: No rhonchi no rales are noted at this time . Abdomen: soft . Skin: no rash seen on limited exam . Musculoskeletal: not rigid . Psychiatric:unable to assess . Neurologic: no seizure no involuntary movements         Lab Data:   Basic Metabolic Panel: Recent Labs  Lab 07/03/19 0358 07/04/19 0910 07/05/19 0443 07/06/19 0624 07/07/19 0510 07/08/19 0610  NA 149* 143 146* 146* 146* 141  K 3.8 4.4 3.6 4.0 4.0 3.9  CL 113* 111 111 110 112* 106  CO2 28 24 28 31 27 27   GLUCOSE 146* 97 109* 117* 108* 123*  BUN 28* 29* 31* 33* 38* 45*  CREATININE 0.89 0.78 0.83 0.87 1.01 1.08  CALCIUM 8.0* 7.8* 7.7* 7.6* 7.5* 7.7*  MG 2.0 2.1 2.2 2.5*  --   --   PHOS 2.1* 2.3* 2.6 2.4*  --   --     ABG: Recent Labs  Lab 06/12/2019 1625 07/04/19 0548 07/05/19 0520  PHART 7.490* 7.518* 7.502*  PCO2ART 36.3 35.3 38.3  PO2ART 98.3 97.0  85.1  HCO3 27.4 28.6* 29.7*  O2SAT 98.0 98.1 97.4    Liver Function Tests: Recent Labs  Lab 07/03/19 0358 07/04/19 0910 07/05/19 0443  AST 78*  --  123*  ALT 100*  --  111*  ALKPHOS 125  --  168*  BILITOT 1.0  --  0.8  PROT 4.2*  --  4.1*  ALBUMIN 1.8* 1.7* 1.6*   No results for input(s): LIPASE, AMYLASE in the last 168 hours. Recent Labs  Lab 07/05/19 0443 07/07/19 0510  AMMONIA 22 28    CBC: Recent Labs  Lab 07/03/19 0358 07/04/19 0910 07/05/19 0443 07/06/19 0624  WBC 7.0 9.7 9.0 9.7  NEUTROABS 5.7  --   --   --   HGB 10.8* 11.2* 10.8* 10.8*  HCT 33.6* 34.4* 34.0* 33.1*  MCV 96.0 95.8 97.4 97.6  PLT 193 303 347 357    Cardiac Enzymes: No results for input(s): CKTOTAL, CKMB, CKMBINDEX, TROPONINI in the last 168 hours.  BNP (last 3 results) No results for input(s): BNP in the last 8760 hours.  ProBNP (last 3 results) No results for input(s): PROBNP in the last 8760 hours.  Radiological Exams: 07/08/19 RENAL  Result Date: 07/07/2019 CLINICAL DATA:  Oliguria. EXAM: RENAL / URINARY TRACT ULTRASOUND COMPLETE COMPARISON:  Right upper quadrant ultrasound dated July 04, 2019. FINDINGS: Right Kidney: Renal  measurements: 8.7 x 5.2 x 5.7 cm = volume: 134 mL. Mild cortical thinning. Echogenicity within normal limits. No mass or hydronephrosis visualized. Left Kidney: Renal measurements: 12.0 x 4.9 x 5.6 cm = volume: 171 mL. Echogenicity within normal limits. No mass or hydronephrosis visualized. Two small simple cysts measuring up to 2.1 cm. Bladder: Not visualized. Other: Unchanged cirrhosis and ascites. IMPRESSION: 1. No acute abnormality. 2. Mild right renal cortical thinning. 3. Unchanged cirrhosis and ascites. Electronically Signed   By: Titus Dubin M.D.   On: 07/07/2019 16:50   DG CHEST PORT 1 VIEW  Result Date: 07/08/2019 CLINICAL DATA:  Weakness. EXAM: PORTABLE CHEST 1 VIEW COMPARISON:  July 07, 2019. FINDINGS: Stable cardiomediastinal silhouette.  Endotracheal nasogastric tubes are unchanged in position. No pneumothorax is noted. Left internal jugular catheter is unchanged. Stable bibasilar atelectasis or infiltrates are noted with associated pleural effusions. Bony thorax is unremarkable. IMPRESSION: Stable support apparatus. Stable bibasilar atelectasis or infiltrates are noted with associated pleural effusions. Electronically Signed   By: Marijo Conception M.D.   On: 07/08/2019 07:56   DG CHEST PORT 1 VIEW  Result Date: 07/07/2019 CLINICAL DATA:  Ventilator dependent pneumonia. EXAM: PORTABLE CHEST 1 VIEW COMPARISON:  Chest radiograph 07/05/2019 FINDINGS: ET tube 4.9 cm above the level of the carina. NG tube passes below level of left hemidiaphragm with tip excluded from the field of view. Unchanged central venous catheter with tip projecting in the region of the cavoatrial junction. Overlying cardiac monitoring leads. Heart size within normal limits. Hazy bibasilar opacities are similar to prior examination and consistent with pleural effusions with atelectasis and/or consolidation. No evidence of pneumothorax. No acute bony abnormality. IMPRESSION: Unchanged support apparatus. Unchanged hazy bibasilar opacities consistent with pleural effusions with atelectasis and/or consolidation. Electronically Signed   By: Kellie Simmering DO   On: 07/07/2019 07:22    Assessment/Plan Active Problems:   Acute on chronic respiratory failure with hypoxia (HCC)   Severe sepsis (HCC)   Alcoholic cirrhosis of liver (HCC)   Adrenal insufficiency (HCC)   Dementia (HCC)   Bilateral pleural effusion   1. Acute on chronic respiratory failure with hypoxia the plan is to continue with weaning to extubation.  Will place the patient on 8/5 try this for about an hour if the patient does well we'll plan on extubation and then placed on BiPAP 12/8. 2. Severe sepsis this has resolved hemodynamics are stable we'll continue with present management. 3. Alcoholic cirrhosis of  the liver patient does have significant ascites we'll continue to monitor his fluid status very closely diuretics as tolerated 4. Adrenal insufficiency supplement as necessary 5. Dementia no change 6. Bilateral effusions secondary to the ascites on the right side we'll continue with supportive care   I have personally seen and evaluated the patient, evaluated laboratory and imaging results, formulated the assessment and plan and placed orders.  Time 35 minutes patient with extended discussion with the primary care team The Patient requires high complexity decision making for assessment and support.  Rounds were done with the Respiratory Therapy Director and Staff therapists and discussed with nursing staff also.  [name], MD [facility] Pulmonary Critical Care Medicine Sleep Medicine

## 2019-07-09 ENCOUNTER — Other Ambulatory Visit (HOSPITAL_COMMUNITY): Payer: Medicare Other

## 2019-07-09 DIAGNOSIS — K7030 Alcoholic cirrhosis of liver without ascites: Secondary | ICD-10-CM | POA: Diagnosis not present

## 2019-07-09 DIAGNOSIS — E2740 Unspecified adrenocortical insufficiency: Secondary | ICD-10-CM | POA: Diagnosis not present

## 2019-07-09 DIAGNOSIS — J9621 Acute and chronic respiratory failure with hypoxia: Secondary | ICD-10-CM | POA: Diagnosis not present

## 2019-07-09 DIAGNOSIS — J90 Pleural effusion, not elsewhere classified: Secondary | ICD-10-CM | POA: Diagnosis not present

## 2019-07-09 NOTE — Progress Notes (Signed)
Request to IR for possible paracentesis. Per provider recent bladder scan showed 300 mL but bladder was decompressed so there was concern for possible ascites.  Abdominal US of all four quadrants shows no ascites that is amenable to percutaneous drainage. No procedure was performed, ordering provider aware.  [place] images are available for review under imaging section of Epic.   Candiss Norse, PA-C

## 2019-07-09 NOTE — Progress Notes (Signed)
Pulmonary Critical Care Medicine [facility] [place]   PULMONARY CRITICAL CARE SERVICE  PROGRESS NOTE  Date of Service: 07/09/2019  Joel Ruiz  FHL:[id number]  DOB: 1932-02-26   DOA: 06/25/2019  Referring Physician: Carron Curie, MD  HPI: Joel Ruiz is a 85 y.o. male seen for follow up of Acute on Chronic Respiratory Failure.  Patient is using a BiPAP at nighttime has some increase in secretions requiring frequent suctioning.  The patient otherwise has had no desaturations noted  Medications: Reviewed on Rounds  Physical Exam:  Vitals: Temperature 96.5 pulse 70 respiratory 18 blood pressure 126/49 saturations 95%  Ventilator Settings BiPAP at nighttime  . General: Comfortable at this time . Eyes: Grossly normal lids, irises & conjunctiva . ENT: grossly tongue is normal . Neck: no obvious mass . Cardiovascular: S1 S2 normal no gallop . Respiratory: No rhonchi coarse breath sounds . Abdomen: soft . Skin: no rash seen on limited exam . Musculoskeletal: not rigid . Psychiatric:unable to assess . Neurologic: no seizure no involuntary movements         Lab Data:   Basic Metabolic Panel: Recent Labs  Lab 07/03/19 0358 07/04/19 0910 07/05/19 0443 07/06/19 0624 07/07/19 0510 07/08/19 0610  NA 149* 143 146* 146* 146* 141  K 3.8 4.4 3.6 4.0 4.0 3.9  CL 113* 111 111 110 112* 106  CO2 28 24 28 31 27 27   GLUCOSE 146* 97 109* 117* 108* 123*  BUN 28* 29* 31* 33* 38* 45*  CREATININE 0.89 0.78 0.83 0.87 1.01 1.08  CALCIUM 8.0* 7.8* 7.7* 7.6* 7.5* 7.7*  MG 2.0 2.1 2.2 2.5*  --   --   PHOS 2.1* 2.3* 2.6 2.4*  --   --     ABG: Recent Labs  Lab 06/21/2019 1625 07/04/19 0548 07/05/19 0520  PHART 7.490* 7.518* 7.502*  PCO2ART 36.3 35.3 38.3  PO2ART 98.3 97.0 85.1  HCO3 27.4 28.6* 29.7*  O2SAT 98.0 98.1 97.4    Liver Function Tests: Recent Labs  Lab 07/03/19 0358 07/04/19 0910 07/05/19 0443  AST 78*  --  123*  ALT 100*  --  111*   ALKPHOS 125  --  168*  BILITOT 1.0  --  0.8  PROT 4.2*  --  4.1*  ALBUMIN 1.8* 1.7* 1.6*   No results for input(s): LIPASE, AMYLASE in the last 168 hours. Recent Labs  Lab 07/05/19 0443 07/07/19 0510  AMMONIA 22 28    CBC: Recent Labs  Lab 07/03/19 0358 07/04/19 0910 07/05/19 0443 07/06/19 0624  WBC 7.0 9.7 9.0 9.7  NEUTROABS 5.7  --   --   --   HGB 10.8* 11.2* 10.8* 10.8*  HCT 33.6* 34.4* 34.0* 33.1*  MCV 96.0 95.8 97.4 97.6  PLT 193 303 347 357    Cardiac Enzymes: No results for input(s): CKTOTAL, CKMB, CKMBINDEX, TROPONINI in the last 168 hours.  BNP (last 3 results) No results for input(s): BNP in the last 8760 hours.  ProBNP (last 3 results) No results for input(s): PROBNP in the last 8760 hours.  Radiological Exams: 07/08/19 RENAL  Result Date: 07/07/2019 CLINICAL DATA:  Oliguria. EXAM: RENAL / URINARY TRACT ULTRASOUND COMPLETE COMPARISON:  Right upper quadrant ultrasound dated July 04, 2019. FINDINGS: Right Kidney: Renal measurements: 8.7 x 5.2 x 5.7 cm = volume: 134 mL. Mild cortical thinning. Echogenicity within normal limits. No mass or hydronephrosis visualized. Left Kidney: Renal measurements: 12.0 x 4.9 x 5.6 cm = volume: 171 mL. Echogenicity within normal limits.  No mass or hydronephrosis visualized. Two small simple cysts measuring up to 2.1 cm. Bladder: Not visualized. Other: Unchanged cirrhosis and ascites. IMPRESSION: 1. No acute abnormality. 2. Mild right renal cortical thinning. 3. Unchanged cirrhosis and ascites. Electronically Signed   By: Titus Dubin M.D.   On: 07/07/2019 16:50   DG CHEST PORT 1 VIEW  Result Date: 07/08/2019 CLINICAL DATA:  Weakness. EXAM: PORTABLE CHEST 1 VIEW COMPARISON:  July 07, 2019. FINDINGS: Stable cardiomediastinal silhouette. Endotracheal nasogastric tubes are unchanged in position. No pneumothorax is noted. Left internal jugular catheter is unchanged. Stable bibasilar atelectasis or infiltrates are noted with  associated pleural effusions. Bony thorax is unremarkable. IMPRESSION: Stable support apparatus. Stable bibasilar atelectasis or infiltrates are noted with associated pleural effusions. Electronically Signed   By: Marijo Conception M.D.   On: 07/08/2019 07:56   DG Abd Portable 1V  Result Date: 07/08/2019 CLINICAL DATA:  Acute on chronic respiratory failure. EXAM: PORTABLE ABDOMEN - 1 VIEW COMPARISON:  07-21-2019. FINDINGS: The enteric tube tip projects over the gastric body. The side hole is likely near the GE junction. The visualized bowel gas pattern is nonspecific and nonobstructive. IMPRESSION: Enteric tube tip projects over the gastric body. Electronically Signed   By: Constance Holster M.D.   On: 07/08/2019 18:58    Assessment/Plan Active Problems:   Acute on chronic respiratory failure with hypoxia (HCC)   Severe sepsis (HCC)   Alcoholic cirrhosis of liver (HCC)   Adrenal insufficiency (HCC)   Dementia (HCC)   Bilateral pleural effusion   1. Acute on chronic respiratory failure with hypoxia we will continue with using BiPAP at nighttime oxygen therapy during the daytime needs ongoing aggressive pulmonary toilet 2. Severe sepsis resolved hemodynamics are stable 3. Alcohol cirrhosis at baseline we will continue present management 4. Adrenal insufficiency we will continue to monitor 5. Dementia no change 6. Pleural effusions follow radiologically   I have personally seen and evaluated the patient, evaluated laboratory and imaging results, formulated the assessment and plan and placed orders. The Patient requires high complexity decision making for assessment and support.  Rounds were done with the Respiratory Therapy Director and Staff therapists and discussed with nursing staff also.  [name], MD [facility] Pulmonary Critical Care Medicine Sleep Medicine

## 2019-07-10 ENCOUNTER — Other Ambulatory Visit (HOSPITAL_COMMUNITY): Payer: Medicare Other

## 2019-07-10 DIAGNOSIS — J9621 Acute and chronic respiratory failure with hypoxia: Secondary | ICD-10-CM | POA: Diagnosis not present

## 2019-07-10 DIAGNOSIS — E2740 Unspecified adrenocortical insufficiency: Secondary | ICD-10-CM | POA: Diagnosis not present

## 2019-07-10 DIAGNOSIS — K7030 Alcoholic cirrhosis of liver without ascites: Secondary | ICD-10-CM | POA: Diagnosis not present

## 2019-07-10 DIAGNOSIS — J90 Pleural effusion, not elsewhere classified: Secondary | ICD-10-CM | POA: Diagnosis not present

## 2019-07-10 LAB — RENAL FUNCTION PANEL
Albumin: 2.2 g/dL — ABNORMAL LOW (ref 3.5–5.0)
Anion gap: 10 (ref 5–15)
BUN: 37 mg/dL — ABNORMAL HIGH (ref 8–23)
CO2: 27 mmol/L (ref 22–32)
Calcium: 8.2 mg/dL — ABNORMAL LOW (ref 8.9–10.3)
Chloride: 103 mmol/L (ref 98–111)
Creatinine, Ser: 1 mg/dL (ref 0.61–1.24)
GFR calc Af Amer: >60 mL/min (ref >60)
GFR calc non Af Amer: >60 mL/min (ref >60)
Glucose, Bld: 102 mg/dL — ABNORMAL HIGH (ref 70–99)
Phosphorus: 3.6 mg/dL (ref 2.5–4.6)
Potassium: 3.8 mmol/L (ref 3.5–5.1)
Sodium: 140 mmol/L (ref 135–145)

## 2019-07-10 LAB — CBC
HCT: 32.1 % — ABNORMAL LOW (ref 39.0–52.0)
Hemoglobin: 10.4 g/dL — ABNORMAL LOW (ref 13.0–17.0)
MCH: 31.6 pg (ref 26.0–34.0)
MCHC: 32.4 g/dL (ref 30.0–36.0)
MCV: 97.6 fL (ref 80.0–100.0)
Platelets: 384 10*3/uL (ref 150–400)
RBC: 3.29 MIL/uL — ABNORMAL LOW (ref 4.22–5.81)
RDW: 18.6 % — ABNORMAL HIGH (ref 11.5–15.5)
WBC: 8.1 10*3/uL (ref 4.0–10.5)
nRBC: 0 % (ref 0.0–0.2)

## 2019-07-10 LAB — MAGNESIUM: Magnesium: 2.3 mg/dL (ref 1.7–2.4)

## 2019-07-10 NOTE — Progress Notes (Signed)
Pulmonary Critical Care Medicine [facility] [place]   PULMONARY CRITICAL CARE SERVICE  PROGRESS NOTE  Date of Service: 07/10/2019  Joel Ruiz  WCH:[id number]  DOB: 06-28-1932   DOA: 06/15/2019  Referring Physician: Carron Curie, MD  HPI: Joel Ruiz is a 86 y.o. male seen for follow up of Acute on Chronic Respiratory Failure.  Patient successfully extubated now is on 4 L oxygen using the BiPAP as prescribed oxygen requirements are going down  Medications: Reviewed on Rounds  Physical Exam:  Vitals: Temperature 96.3 pulse 79 respiratory rate 20 blood pressure is 138/68 saturations 94%  Ventilator Settings 4 L off the vent  . General: Comfortable at this time . Eyes: Grossly normal lids, irises & conjunctiva . ENT: grossly tongue is normal . Neck: no obvious mass . Cardiovascular: S1 S2 normal no gallop . Respiratory: No rhonchi coarse breath sounds are noted . Abdomen: soft . Skin: no rash seen on limited exam . Musculoskeletal: not rigid . Psychiatric:unable to assess . Neurologic: no seizure no involuntary movements         Lab Data:   Basic Metabolic Panel: Recent Labs  Lab 07/04/19 0910 07/05/19 0443 07/06/19 0624 07/07/19 0510 07/08/19 0610 07/10/19 0522  NA 143 146* 146* 146* 141 140  K 4.4 3.6 4.0 4.0 3.9 3.8  CL 111 111 110 112* 106 103  CO2 24 28 31 27 27 27   GLUCOSE 97 109* 117* 108* 123* 102*  BUN 29* 31* 33* 38* 45* 37*  CREATININE 0.78 0.83 0.87 1.01 1.08 1.00  CALCIUM 7.8* 7.7* 7.6* 7.5* 7.7* 8.2*  MG 2.1 2.2 2.5*  --   --  2.3  PHOS 2.3* 2.6 2.4*  --   --  3.6    ABG: Recent Labs  Lab 07/04/19 0548 07/05/19 0520  PHART 7.518* 7.502*  PCO2ART 35.3 38.3  PO2ART 97.0 85.1  HCO3 28.6* 29.7*  O2SAT 98.1 97.4    Liver Function Tests: Recent Labs  Lab 07/04/19 0910 07/05/19 0443 07/10/19 0522  AST  --  123*  --   ALT  --  111*  --   ALKPHOS  --  168*  --   BILITOT  --  0.8  --   PROT  --  4.1*  --    ALBUMIN 1.7* 1.6* 2.2*   No results for input(s): LIPASE, AMYLASE in the last 168 hours. Recent Labs  Lab 07/05/19 0443 07/07/19 0510  AMMONIA 22 28    CBC: Recent Labs  Lab 07/04/19 0910 07/05/19 0443 07/06/19 0624 07/10/19 0522  WBC 9.7 9.0 9.7 8.1  HGB 11.2* 10.8* 10.8* 10.4*  HCT 34.4* 34.0* 33.1* 32.1*  MCV 95.8 97.4 97.6 97.6  PLT 303 347 357 384    Cardiac Enzymes: No results for input(s): CKTOTAL, CKMB, CKMBINDEX, TROPONINI in the last 168 hours.  BNP (last 3 results) No results for input(s): BNP in the last 8760 hours.  ProBNP (last 3 results) No results for input(s): PROBNP in the last 8760 hours.  Radiological Exams: DG Abd Portable 1V  Result Date: 07/08/2019 CLINICAL DATA:  Acute on chronic respiratory failure. EXAM: PORTABLE ABDOMEN - 1 VIEW COMPARISON:  July 02, 2019. FINDINGS: The enteric tube tip projects over the gastric body. The side hole is likely near the GE junction. The visualized bowel gas pattern is nonspecific and nonobstructive. IMPRESSION: Enteric tube tip projects over the gastric body. Electronically Signed   By: July 04, 2019 M.D.   On: 07/08/2019 18:58  IR ABDOMEN US LIMITED  Result Date: 07/09/2019 CLINICAL DATA:  History of alcoholic cirrhosis. Please perform ascites search ultrasound ultrasound-guided paracentesis as indicated. EXAM: LIMITED ABDOMEN ULTRASOUND FOR ASCITES TECHNIQUE: Limited ultrasound survey for ascites was performed in all four abdominal quadrants. COMPARISON:  None FINDINGS: Sonographic evaluation the abdomen demonstrates a trace amount of intra-abdominal ascites, too small to allow for safe ultrasound-guided paracentesis. No paracentesis attempted. IMPRESSION: Trace amount of intra-abdominal ascites, too small to allow for safe ultrasound-guided paracentesis. Electronically Signed   By: Sandi Mariscal M.D.   On: 07/09/2019 17:48    Assessment/Plan Active Problems:   Acute on chronic respiratory failure  with hypoxia (HCC)   Severe sepsis (HCC)   Alcoholic cirrhosis of liver (HCC)   Adrenal insufficiency (HCC)   Dementia (HCC)   Bilateral pleural effusion   1. Acute on chronic respiratory failure with hypoxia we will continue with oxygen therapy supportive care patient is on 4 L right now use the BiPAP at nighttime 2. Severe sepsis resolved hemodynamics now are improved 3. Alcohol cirrhosis patient is at baseline underwent abdominal ultrasound for possibility of ascites and did not have significant fluid 4. Adrenal insufficiency we will continue with present management 5. Dementia unchanged 6. Bilateral pleural effusions secondary to ascites we will continue to monitor   I have personally seen and evaluated the patient, evaluated laboratory and imaging results, formulated the assessment and plan and placed orders. The Patient requires high complexity decision making for assessment and support.  Rounds were done with the Respiratory Therapy Director and Staff therapists and discussed with nursing staff also.  [name], MD [facility] Pulmonary Critical Care Medicine Sleep Medicine

## 2019-07-10 DEATH — deceased

## 2019-07-11 DIAGNOSIS — K7031 Alcoholic cirrhosis of liver with ascites: Secondary | ICD-10-CM | POA: Diagnosis not present

## 2019-07-11 DIAGNOSIS — J90 Pleural effusion, not elsewhere classified: Secondary | ICD-10-CM | POA: Diagnosis not present

## 2019-07-11 DIAGNOSIS — E2740 Unspecified adrenocortical insufficiency: Secondary | ICD-10-CM | POA: Diagnosis not present

## 2019-07-11 DIAGNOSIS — J9621 Acute and chronic respiratory failure with hypoxia: Secondary | ICD-10-CM | POA: Diagnosis not present

## 2019-07-11 NOTE — Progress Notes (Addendum)
Pulmonary Critical Care Medicine [facility] [place]   PULMONARY CRITICAL CARE SERVICE  PROGRESS NOTE  Date of Service: 07/11/2019  Joel Ruiz  UDJ:[id number]  DOB: June 09, 1932   DOA: 07/06/2019  Referring Physician: Carron Curie, MD  HPI: Joel Ruiz is a 86 y.o. male seen for follow up of Acute on Chronic Respiratory Failure.  Patient currently is on BiPAP 12/8.  Last chest x-ray was showing some atelectasis however family is not wanting to pursue anything aggressively patient has been started on Mucomyst as well as nebulizers.  Medications: Reviewed on Rounds  Physical Exam:  Vitals: Temperature 97.8 pulse 100 respiratory rate 33 blood pressure 153/52 saturations 95%  Ventilator Settings temperature 97.8 pulse 100 respiratory rate 33 blood pressure 153/52 saturations 95%  . General: Comfortable at this time . Eyes: Grossly normal lids, irises & conjunctiva . ENT: grossly tongue is normal . Neck: no obvious mass . Cardiovascular: S1 S2 normal no gallop . Respiratory: Scattered rhonchi expansion is equal . Abdomen: soft . Skin: no rash seen on limited exam . Musculoskeletal: not rigid . Psychiatric:unable to assess . Neurologic: no seizure no involuntary movements         Lab Data:   Basic Metabolic Panel: Recent Labs  Lab 07/05/19 0443 07/06/19 0624 07/07/19 0510 07/08/19 0610 07/10/19 0522  NA 146* 146* 146* 141 140  K 3.6 4.0 4.0 3.9 3.8  CL 111 110 112* 106 103  CO2 28 31 27 27 27   GLUCOSE 109* 117* 108* 123* 102*  BUN 31* 33* 38* 45* 37*  CREATININE 0.83 0.87 1.01 1.08 1.00  CALCIUM 7.7* 7.6* 7.5* 7.7* 8.2*  MG 2.2 2.5*  --   --  2.3  PHOS 2.6 2.4*  --   --  3.6    ABG: Recent Labs  Lab 07/05/19 0520  PHART 7.502*  PCO2ART 38.3  PO2ART 85.1  HCO3 29.7*  O2SAT 97.4    Liver Function Tests: Recent Labs  Lab 07/05/19 0443 07/10/19 0522  AST 123*  --   ALT 111*  --   ALKPHOS 168*  --   BILITOT 0.8  --   PROT  4.1*  --   ALBUMIN 1.6* 2.2*   No results for input(s): LIPASE, AMYLASE in the last 168 hours. Recent Labs  Lab 07/05/19 0443 07/07/19 0510  AMMONIA 22 28    CBC: Recent Labs  Lab 07/05/19 0443 07/06/19 0624 07/10/19 0522  WBC 9.0 9.7 8.1  HGB 10.8* 10.8* 10.4*  HCT 34.0* 33.1* 32.1*  MCV 97.4 97.6 97.6  PLT 347 357 384    Cardiac Enzymes: No results for input(s): CKTOTAL, CKMB, CKMBINDEX, TROPONINI in the last 168 hours.  BNP (last 3 results) No results for input(s): BNP in the last 8760 hours.  ProBNP (last 3 results) No results for input(s): PROBNP in the last 8760 hours.  Radiological Exams: DG CHEST PORT 1 VIEW  Result Date: 07/10/2019 CLINICAL DATA:  Respiratory distress EXAM: PORTABLE CHEST 1 VIEW COMPARISON:  07/08/2019 FINDINGS: Interval removal of endotracheal tube. Enteric tube courses below the diaphragm with distal tip and side hole beyond the inferior margin of the film. Left-sided central venous catheter is unchanged in positioning. Interval complete whiteout of the left hemithorax with associated volume loss manifested by leftward shift of the heart and mediastinal structures. Persistent right-sided pleural effusion with hazy right basilar opacity. No right-sided pneumothorax. IMPRESSION: 1. Interval complete whiteout of the left hemithorax with associated volume loss. Findings likely represent a combination  of atelectasis and pleural effusion. 2. Persistent right pleural effusion with hazy right basilar opacity. 3. Interval extubation.  Otherwise, stable support apparatus. Electronically Signed   By: Davina Poke D.O.   On: 07/10/2019 15:13    Assessment/Plan Active Problems:   Acute on chronic respiratory failure with hypoxia (HCC)   Severe sepsis (HCC)   Alcoholic cirrhosis of liver (HCC)   Adrenal insufficiency (HCC)   Dementia (HCC)   Bilateral pleural effusion   1. Acute on chronic respiratory failure with hypoxia plan is to continue with  aggressive pulmonary toilet.  Family wants to make the patient comfort care no intubation plan right now unless they change their mind. 2. Alcohol cirrhosis no changes supportive care 3. Adrenal insufficiency patient is at baseline 4. Dementia patient's unchanged we will continue with supportive care 5. Bilateral pleural effusion patient has whiteout on the left side likely is secretion related rather than fluid related 6. Severe sepsis resolved hemodynamics are stable   I have personally seen and evaluated the patient, evaluated laboratory and imaging results, formulated the assessment and plan and placed orders. The Patient requires high complexity decision making with multiple systems involvement.  Rounds were done with the Respiratory Therapy Director and Staff therapists and discussed with nursing staff also.  Time 35 minutes acute change in status  [name], MD [facility] Pulmonary Critical Care Medicine Sleep Medicine

## 2019-07-12 ENCOUNTER — Other Ambulatory Visit (HOSPITAL_COMMUNITY): Payer: Medicare Other

## 2019-07-12 DIAGNOSIS — E2740 Unspecified adrenocortical insufficiency: Secondary | ICD-10-CM | POA: Diagnosis not present

## 2019-07-12 DIAGNOSIS — J9621 Acute and chronic respiratory failure with hypoxia: Secondary | ICD-10-CM | POA: Diagnosis not present

## 2019-07-12 DIAGNOSIS — J90 Pleural effusion, not elsewhere classified: Secondary | ICD-10-CM | POA: Diagnosis not present

## 2019-07-12 DIAGNOSIS — K7030 Alcoholic cirrhosis of liver without ascites: Secondary | ICD-10-CM | POA: Diagnosis not present

## 2019-07-12 LAB — BASIC METABOLIC PANEL
Anion gap: 10 (ref 5–15)
BUN: 36 mg/dL — ABNORMAL HIGH (ref 8–23)
CO2: 26 mmol/L (ref 22–32)
Calcium: 7.8 mg/dL — ABNORMAL LOW (ref 8.9–10.3)
Chloride: 103 mmol/L (ref 98–111)
Creatinine, Ser: 1.08 mg/dL (ref 0.61–1.24)
GFR calc Af Amer: >60 mL/min (ref >60)
GFR calc non Af Amer: >60 mL/min (ref >60)
Glucose, Bld: 84 mg/dL (ref 70–99)
Potassium: 3.6 mmol/L (ref 3.5–5.1)
Sodium: 139 mmol/L (ref 135–145)

## 2019-07-12 LAB — AMMONIA: Ammonia: 28 umol/L (ref 9–35)

## 2019-07-12 NOTE — Progress Notes (Signed)
Pulmonary Critical Care Medicine [facility] [place]   PULMONARY CRITICAL CARE SERVICE  PROGRESS NOTE  Date of Service: 07/12/2019  Joel Ruiz  FOY:[id number]  DOB: 12/04/1931   DOA: 26-Jul-2019  Referring Physician: Carron Curie, MD  HPI: Joel Ruiz is a 86 y.o. male seen for follow up of Acute on Chronic Respiratory Failure.  Patient currently is on BiPAP 16/8 oxygen requirements are at 80% patient is now DNR  Medications: Reviewed on Rounds  Physical Exam:  Vitals: Temperature 95.3 pulse 71 respiratory 23 blood pressure is 129/71 saturations 93%  Ventilator Settings BiPAP 16/8 FiO2 80%  . General: Comfortable at this time . Eyes: Grossly normal lids, irises & conjunctiva . ENT: grossly tongue is normal . Neck: no obvious mass . Cardiovascular: S1 S2 normal no gallop . Respiratory: No rhonchi coarse breath sounds are noted . Abdomen: soft . Skin: no rash seen on limited exam . Musculoskeletal: not rigid . Psychiatric:unable to assess . Neurologic: no seizure no involuntary movements         Lab Data:   Basic Metabolic Panel: Recent Labs  Lab 07/06/19 0624 07/07/19 0510 07/08/19 0610 07/10/19 0522 07/12/19 1012  NA 146* 146* 141 140 139  K 4.0 4.0 3.9 3.8 3.6  CL 110 112* 106 103 103  CO2 31 27 27 27 26   GLUCOSE 117* 108* 123* 102* 84  BUN 33* 38* 45* 37* 36*  CREATININE 0.87 1.01 1.08 1.00 1.08  CALCIUM 7.6* 7.5* 7.7* 8.2* 7.8*  MG 2.5*  --   --  2.3  --   PHOS 2.4*  --   --  3.6  --     ABG: No results for input(s): PHART, PCO2ART, PO2ART, HCO3, O2SAT in the last 168 hours.  Liver Function Tests: Recent Labs  Lab 07/10/19 0522  ALBUMIN 2.2*   No results for input(s): LIPASE, AMYLASE in the last 168 hours. Recent Labs  Lab 07/07/19 0510 07/12/19 1012  AMMONIA 28 28    CBC: Recent Labs  Lab 07/06/19 0624 07/10/19 0522  WBC 9.7 8.1  HGB 10.8* 10.4*  HCT 33.1* 32.1*  MCV 97.6 97.6  PLT 357 384     Cardiac Enzymes: No results for input(s): CKTOTAL, CKMB, CKMBINDEX, TROPONINI in the last 168 hours.  BNP (last 3 results) No results for input(s): BNP in the last 8760 hours.  ProBNP (last 3 results) No results for input(s): PROBNP in the last 8760 hours.  Radiological Exams: DG CHEST PORT 1 VIEW  Result Date: 07/12/2019 CLINICAL DATA:  Update status  Atelectasis EXAM: PORTABLE CHEST 1 VIEW COMPARISON:  Chest radiograph 07/10/2019 FINDINGS: Stable support apparatus. There is significantly improved aeration in the left lung. Persistent left basilar consolidation and moderate pleural effusion. There are new fluffy airspace opacities in the right perihilar lung. Persistent hazy right basilar opacities likely a combination of atelectasis and pleural fluid. No pneumothorax. IMPRESSION: 1. Significantly improved aeration of the left lung. 2. New airspace opacities in the right perihilar lung could reflect edema or infiltrate. 3. Persistent bibasilar opacities likely representing a combination of atelectasis and pleural fluid. Pleural effusion on the left is moderate size. Electronically Signed   By: 09/07/2019 M.D.   On: 07/12/2019 07:15   DG CHEST PORT 1 VIEW  Result Date: 07/10/2019 CLINICAL DATA:  Respiratory distress EXAM: PORTABLE CHEST 1 VIEW COMPARISON:  07/08/2019 FINDINGS: Interval removal of endotracheal tube. Enteric tube courses below the diaphragm with distal tip and side hole beyond the  inferior margin of the film. Left-sided central venous catheter is unchanged in positioning. Interval complete whiteout of the left hemithorax with associated volume loss manifested by leftward shift of the heart and mediastinal structures. Persistent right-sided pleural effusion with hazy right basilar opacity. No right-sided pneumothorax. IMPRESSION: 1. Interval complete whiteout of the left hemithorax with associated volume loss. Findings likely represent a combination of atelectasis and pleural  effusion. 2. Persistent right pleural effusion with hazy right basilar opacity. 3. Interval extubation.  Otherwise, stable support apparatus. Electronically Signed   By: Davina Poke D.O.   On: 07/10/2019 15:13    Assessment/Plan Active Problems:   Acute on chronic respiratory failure with hypoxia (HCC)   Severe sepsis (HCC)   Alcoholic cirrhosis of liver (HCC)   Adrenal insufficiency (HCC)   Dementia (HCC)   Bilateral pleural effusion   1. Acute on chronic respiratory failure hypoxia plan is to continue with BiPAP supportive care patient is DNR overall prognosis is poor 2. Severe sepsis hemodynamically remains stable 3. Alcoholic cirrhosis no change 4. Adrenal insufficiency at baseline 5. Dementia patient is at baseline 6. Bilateral effusions follow-up x-rays noted with significantly effusion still   I have personally seen and evaluated the patient, evaluated laboratory and imaging results, formulated the assessment and plan and placed orders. The Patient requires high complexity decision making with multiple systems involvement.  Rounds were done with the Respiratory Therapy Director and Staff therapists and discussed with nursing staff also.  [name], MD [facility] Pulmonary Critical Care Medicine Sleep Medicine

## 2019-07-13 DIAGNOSIS — J9621 Acute and chronic respiratory failure with hypoxia: Secondary | ICD-10-CM | POA: Diagnosis not present

## 2019-07-13 DIAGNOSIS — K7030 Alcoholic cirrhosis of liver without ascites: Secondary | ICD-10-CM | POA: Diagnosis not present

## 2019-07-13 DIAGNOSIS — E2740 Unspecified adrenocortical insufficiency: Secondary | ICD-10-CM | POA: Diagnosis not present

## 2019-07-13 DIAGNOSIS — J90 Pleural effusion, not elsewhere classified: Secondary | ICD-10-CM | POA: Diagnosis not present

## 2019-07-13 NOTE — Progress Notes (Signed)
Pulmonary Critical Care Medicine [facility] [place]   PULMONARY CRITICAL CARE SERVICE  PROGRESS NOTE  Date of Service: 07/13/2019  [name]  YIR:[id number]  DOB: 1932-04-11   DOA: 06/13/2019  Referring Physician: Carron Curie, MD  HPI: Joel Ruiz is a 86 y.o. male seen for follow up of Acute on Chronic Respiratory Failure.  Patient currently is on BiPAP has been on 16/8 plan 80% oxygen.  Spoke with respiratory therapy today to see if he is able to tolerate high flow heated oxygen.  This may be better than continuous BiPAP.  Patient is DNR so he is not to be intubated  Medications: Reviewed on Rounds  Physical Exam:  Vitals: Temperature is 97.4 pulse 98 respiratory rate 30 blood pressure is 108/55 saturations 100%  Ventilator Settings BiPAP 16/8 FiO2 80%  . General: Comfortable at this time . Eyes: Grossly normal lids, irises & conjunctiva . ENT: grossly tongue is normal . Neck: no obvious mass . Cardiovascular: S1 S2 normal no gallop . Respiratory: No rhonchi no rales . Abdomen: soft . Skin: no rash seen on limited exam . Musculoskeletal: not rigid . Psychiatric:unable to assess . Neurologic: no seizure no involuntary movements         Lab Data:   Basic Metabolic Panel: Recent Labs  Lab 07/07/19 0510 07/08/19 0610 07/10/19 0522 07/12/19 1012  NA 146* 141 140 139  K 4.0 3.9 3.8 3.6  CL 112* 106 103 103  CO2 27 27 27 26   GLUCOSE 108* 123* 102* 84  BUN 38* 45* 37* 36*  CREATININE 1.01 1.08 1.00 1.08  CALCIUM 7.5* 7.7* 8.2* 7.8*  MG  --   --  2.3  --   PHOS  --   --  3.6  --     ABG: No results for input(s): PHART, PCO2ART, PO2ART, HCO3, O2SAT in the last 168 hours.  Liver Function Tests: Recent Labs  Lab 07/10/19 0522  ALBUMIN 2.2*   No results for input(s): LIPASE, AMYLASE in the last 168 hours. Recent Labs  Lab 07/07/19 0510 07/12/19 1012  AMMONIA 28 28    CBC: Recent Labs  Lab 07/10/19 0522  WBC 8.1  HGB  10.4*  HCT 32.1*  MCV 97.6  PLT 384    Cardiac Enzymes: No results for input(s): CKTOTAL, CKMB, CKMBINDEX, TROPONINI in the last 168 hours.  BNP (last 3 results) No results for input(s): BNP in the last 8760 hours.  ProBNP (last 3 results) No results for input(s): PROBNP in the last 8760 hours.  Radiological Exams: DG CHEST PORT 1 VIEW  Result Date: 07/12/2019 CLINICAL DATA:  Update status  Atelectasis EXAM: PORTABLE CHEST 1 VIEW COMPARISON:  Chest radiograph 07/10/2019 FINDINGS: Stable support apparatus. There is significantly improved aeration in the left lung. Persistent left basilar consolidation and moderate pleural effusion. There are new fluffy airspace opacities in the right perihilar lung. Persistent hazy right basilar opacities likely a combination of atelectasis and pleural fluid. No pneumothorax. IMPRESSION: 1. Significantly improved aeration of the left lung. 2. New airspace opacities in the right perihilar lung could reflect edema or infiltrate. 3. Persistent bibasilar opacities likely representing a combination of atelectasis and pleural fluid. Pleural effusion on the left is moderate size. Electronically Signed   By: 09/07/2019 M.D.   On: 07/12/2019 07:15    Assessment/Plan Active Problems:   Acute on chronic respiratory failure with hypoxia (HCC)   Severe sepsis (HCC)   Alcoholic cirrhosis of liver (HCC)  Adrenal insufficiency (HCC)   Dementia (HCC)   Bilateral pleural effusion   1. Acute on chronic respiratory failure with hypoxia patient still has significant hypoxia noted still requiring higher FiO2 levels.  Currently is on 80% FiO2.  Spoke with him regarding the possibility of changing over to high flow nasal cannula if able to tolerate.  Patient is DNR as already mentioned and not to be intubated. 2. Severe sepsis right now hemodynamics are stable 3. Alcoholic cirrhosis no change 4. Adrenal insufficiency continue with present management 5. Dementia at  baseline 6. Bilateral pleural effusions at baseline we will continue present management   I have personally seen and evaluated the patient, evaluated laboratory and imaging results, formulated the assessment and plan and placed orders. The Patient requires high complexity decision making with multiple systems involvement.  Rounds were done with the Respiratory Therapy Director and Staff therapists and discussed with nursing staff also.  Time 35 minutes  [name], MD [facility] Pulmonary Critical Care Medicine Sleep Medicine

## 2019-07-14 DIAGNOSIS — E2740 Unspecified adrenocortical insufficiency: Secondary | ICD-10-CM | POA: Diagnosis not present

## 2019-07-14 DIAGNOSIS — J9621 Acute and chronic respiratory failure with hypoxia: Secondary | ICD-10-CM | POA: Diagnosis not present

## 2019-07-14 DIAGNOSIS — J90 Pleural effusion, not elsewhere classified: Secondary | ICD-10-CM | POA: Diagnosis not present

## 2019-07-14 DIAGNOSIS — K7031 Alcoholic cirrhosis of liver with ascites: Secondary | ICD-10-CM | POA: Diagnosis not present

## 2019-07-14 NOTE — Progress Notes (Addendum)
Pulmonary Critical Care Medicine [facility]   PULMONARY CRITICAL CARE SERVICE  PROGRESS NOTE  Date of Service: 07/14/2019  Chucky Homes  CZY:[id number]  DOB: [birth year]   DOA: 06/25/2019  Referring Physician: Merton Border, MD  HPI: Grayson Pfefferle is a 86 y.o. male seen for follow up of Acute on Chronic Respiratory Failure.  Patient was attempted on high flow nasal cannula as discussed on rounds yesterday however it appears the patient almost immediately desaturated.  Patient was placed back on the BiPAP and currently is on 16/8 and is still requiring 100% FiO2.  Medications: Reviewed on Rounds  Physical Exam:  Vitals: Temperature is 96.2 pulse 75 respiratory rate 32 blood pressure is 151/73 saturations 93%  Ventilator Settings mode ventilation is BiPAP via mask 16/8 FiO2 100%  . General: Comfortable at this time . Eyes: Grossly normal lids, irises & conjunctiva . ENT: grossly tongue is normal . Neck: no obvious mass . Cardiovascular: S1 S2 normal no gallop . Respiratory: No rhonchi no rales are noted at this time . Abdomen: soft . Skin: no rash seen on limited exam . Musculoskeletal: not rigid . Psychiatric:unable to assess . Neurologic: no seizure no involuntary movements         Lab Data:   Basic Metabolic Panel: Recent Labs  Lab 07/08/19 0610 07/10/19 0522 07/12/19 1012  NA 141 140 139  K 3.9 3.8 3.6  CL 106 103 103  CO2 27 27 26   GLUCOSE 123* 102* 84  BUN 45* 37* 36*  CREATININE 1.08 1.00 1.08  CALCIUM 7.7* 8.2* 7.8*  MG  --  2.3  --   PHOS  --  3.6  --     ABG: No results for input(s): PHART, PCO2ART, PO2ART, HCO3, O2SAT in the last 168 hours.  Liver Function Tests: Recent Labs  Lab 07/10/19 0522  ALBUMIN 2.2*   No results for input(s): LIPASE, AMYLASE in the last 168 hours. Recent Labs  Lab 07/12/19 1012  AMMONIA 28    CBC: Recent Labs  Lab 07/10/19 0522  WBC 8.1  HGB 10.4*  HCT 32.1*  MCV 97.6  PLT  384    Cardiac Enzymes: No results for input(s): CKTOTAL, CKMB, CKMBINDEX, TROPONINI in the last 168 hours.  BNP (last 3 results) No results for input(s): BNP in the last 8760 hours.  ProBNP (last 3 results) No results for input(s): PROBNP in the last 8760 hours.  Radiological Exams: No results found.  Assessment/Plan Active Problems:   Acute on chronic respiratory failure with hypoxia (HCC)   Severe sepsis (HCC)   Alcoholic cirrhosis of liver (HCC)   Adrenal insufficiency (HCC)   Dementia (HCC)   Bilateral pleural effusion   1. Acute on chronic respiratory failure with hypoxia we will continue with BiPAP therapy continue secretion management supportive care failed an attempt at high flow nasal cannula.  Not to be intubated patient's overall prognosis is poor 2. Severe sepsis this has resolved hemodynamics are stable we will continue with present management 3. Alcohol cirrhosis supportive care 4. Adrenal insufficiency no change continue with supportive care 5. Dementia at baseline 6. Bilateral effusions at baseline we will continue to support with fluid management   I have personally seen and evaluated the patient, evaluated laboratory and imaging results, formulated the assessment and plan and placed orders. The Patient requires high complexity decision making with multiple systems involvement.  Rounds were done with the Respiratory Therapy Director and Staff therapists and discussed with nursing staff  also.  Time 35 minutes  Yevonne Pax, MD [facility] Pulmonary Critical Care Medicine Sleep Medicine

## 2019-07-15 DIAGNOSIS — J90 Pleural effusion, not elsewhere classified: Secondary | ICD-10-CM | POA: Diagnosis not present

## 2019-07-15 DIAGNOSIS — E2740 Unspecified adrenocortical insufficiency: Secondary | ICD-10-CM | POA: Diagnosis not present

## 2019-07-15 DIAGNOSIS — J9621 Acute and chronic respiratory failure with hypoxia: Secondary | ICD-10-CM | POA: Diagnosis not present

## 2019-07-15 DIAGNOSIS — K7030 Alcoholic cirrhosis of liver without ascites: Secondary | ICD-10-CM | POA: Diagnosis not present

## 2019-07-15 NOTE — Progress Notes (Signed)
Pulmonary Critical Care Medicine [facility] [place]   PULMONARY CRITICAL CARE SERVICE  PROGRESS NOTE  Date of Service: 07/15/2019  Joel Ruiz  OAC:[id number]  DOB: March 05, 1932   DOA: 06/22/2019  Referring Physician: Carron Curie, MD  HPI: Joel Ruiz is a 86 y.o. male seen for follow up of Acute on Chronic Respiratory Failure.  Patient is on BiPAP currently has been on 100% FiO2.  Has not been doing well.  Remains DNR however I think it important goals of care meeting with the family I discussed this with the case management today.  Medications: Reviewed on Rounds  Physical Exam:  Vitals: Temperature 97.9 pulse 76 respiratory rate 26 blood pressure is 152/77 saturations 95%   Ventilator Settings BiPAP 16/8 FiO2 is 100%  . General: Comfortable at this time . Eyes: Grossly normal lids, irises & conjunctiva . ENT: grossly tongue is normal . Neck: no obvious mass . Cardiovascular: S1 S2 normal no gallop . Respiratory: Scattered rhonchi expansion is equal . Abdomen: soft . Skin: no rash seen on limited exam . Musculoskeletal: not rigid . Psychiatric:unable to assess . Neurologic: no seizure no involuntary movements         Lab Data:   Basic Metabolic Panel: Recent Labs  Lab 07/10/19 0522 07/12/19 1012  NA 140 139  K 3.8 3.6  CL 103 103  CO2 27 26  GLUCOSE 102* 84  BUN 37* 36*  CREATININE 1.00 1.08  CALCIUM 8.2* 7.8*  MG 2.3  --   PHOS 3.6  --     ABG: No results for input(s): PHART, PCO2ART, PO2ART, HCO3, O2SAT in the last 168 hours.  Liver Function Tests: Recent Labs  Lab 07/10/19 0522  ALBUMIN 2.2*   No results for input(s): LIPASE, AMYLASE in the last 168 hours. Recent Labs  Lab 07/12/19 1012  AMMONIA 28    CBC: Recent Labs  Lab 07/10/19 0522  WBC 8.1  HGB 10.4*  HCT 32.1*  MCV 97.6  PLT 384    Cardiac Enzymes: No results for input(s): CKTOTAL, CKMB, CKMBINDEX, TROPONINI in the last 168 hours.  BNP (last  3 results) No results for input(s): BNP in the last 8760 hours.  ProBNP (last 3 results) No results for input(s): PROBNP in the last 8760 hours.  Radiological Exams: No results found.  Assessment/Plan Active Problems:   Acute on chronic respiratory failure with hypoxia (HCC)   Severe sepsis (HCC)   Alcoholic cirrhosis of liver (HCC)   Adrenal insufficiency (HCC)   Dementia (HCC)   Bilateral pleural effusion   1. Acute on chronic respiratory failure hypoxia plan is to continue with the BiPAP as of her current recommendations however need to have a family meeting with goals of care.  There are some family difficulties noted I will discuss further with case management 2. Severe sepsis no changes we will continue with supportive care 3. Alcohol cirrhosis patient is at baseline 4. Adrenal insufficiency we will continue with present management 5. Dementia no change 6. Bilateral effusions following clinically   I have personally seen and evaluated the patient, evaluated laboratory and imaging results, formulated the assessment and plan and placed orders. The Patient requires high complexity decision making with multiple systems involvement.  Rounds were done with the Respiratory Therapy Director and Staff therapists and discussed with nursing staff also.  Yevonne Pax, MD [facility] Pulmonary Critical Care Medicine Sleep Medicine

## 2019-07-16 ENCOUNTER — Other Ambulatory Visit (HOSPITAL_COMMUNITY): Payer: Medicare Other

## 2019-07-16 DIAGNOSIS — J9621 Acute and chronic respiratory failure with hypoxia: Secondary | ICD-10-CM | POA: Diagnosis not present

## 2019-07-16 DIAGNOSIS — K7031 Alcoholic cirrhosis of liver with ascites: Secondary | ICD-10-CM | POA: Diagnosis not present

## 2019-07-16 DIAGNOSIS — J90 Pleural effusion, not elsewhere classified: Secondary | ICD-10-CM | POA: Diagnosis not present

## 2019-07-16 DIAGNOSIS — E2740 Unspecified adrenocortical insufficiency: Secondary | ICD-10-CM | POA: Diagnosis not present

## 2019-07-16 LAB — RENAL FUNCTION PANEL
Albumin: 1.5 g/dL — ABNORMAL LOW (ref 3.5–5.0)
Anion gap: 9 (ref 5–15)
BUN: 41 mg/dL — ABNORMAL HIGH (ref 8–23)
CO2: 32 mmol/L (ref 22–32)
Calcium: 7.7 mg/dL — ABNORMAL LOW (ref 8.9–10.3)
Chloride: 104 mmol/L (ref 98–111)
Creatinine, Ser: 1.54 mg/dL — ABNORMAL HIGH (ref 0.61–1.24)
GFR calc Af Amer: 47 mL/min — ABNORMAL LOW (ref >60)
GFR calc non Af Amer: 41 mL/min — ABNORMAL LOW (ref >60)
Glucose, Bld: 105 mg/dL — ABNORMAL HIGH (ref 70–99)
Phosphorus: 4.3 mg/dL (ref 2.5–4.6)
Potassium: 2.4 mmol/L — CL (ref 3.5–5.1)
Sodium: 145 mmol/L (ref 135–145)

## 2019-07-16 LAB — CBC
HCT: 33.2 % — ABNORMAL LOW (ref 39.0–52.0)
Hemoglobin: 10.7 g/dL — ABNORMAL LOW (ref 13.0–17.0)
MCH: 30.6 pg (ref 26.0–34.0)
MCHC: 32.2 g/dL (ref 30.0–36.0)
MCV: 94.9 fL (ref 80.0–100.0)
Platelets: 204 10*3/uL (ref 150–400)
RBC: 3.5 MIL/uL — ABNORMAL LOW (ref 4.22–5.81)
RDW: 17.9 % — ABNORMAL HIGH (ref 11.5–15.5)
WBC: 11.3 10*3/uL — ABNORMAL HIGH (ref 4.0–10.5)
nRBC: 0 % (ref 0.0–0.2)

## 2019-07-16 LAB — MAGNESIUM: Magnesium: 1.7 mg/dL (ref 1.7–2.4)

## 2019-08-10 NOTE — Progress Notes (Signed)
Pulmonary Critical Care Medicine [place]   PULMONARY CRITICAL CARE SERVICE  PROGRESS NOTE  Date of Service: 2019-08-06  Joel Ruiz  FGH:[id number]  DOB: December 18, 1931   DOA: 06/10/2019  Referring Physician: Merton Border, MD  HPI: Joel Ruiz is a 86 y.o. male seen for follow up of Acute on Chronic Respiratory Failure.  Patient is still on BiPAP has been on BiPAP 20/10 and is on 100% FiO2 with saturations running in the 84% range.  Overall he is not doing well and is not likely to survive  Medications: Reviewed on Rounds  Physical Exam:  Vitals: Temperature 97.6 pulse 75 respiratory 26 blood pressure is 132/74 saturations 84%  Ventilator Settings on BiPAP 20/10 FiO2 100%  . General: Comfortable at this time . Eyes: Grossly normal lids, irises & conjunctiva . ENT: grossly tongue is normal . Neck: no obvious mass . Cardiovascular: S1 S2 normal no gallop . Respiratory: Coarse rhonchi noted bilaterally . Abdomen: soft . Skin: no rash seen on limited exam . Musculoskeletal: not rigid . Psychiatric:unable to assess . Neurologic: no seizure no involuntary movements         Lab Data:   Basic Metabolic Panel: Recent Labs  Lab 07/10/19 0522 07/12/19 1012 08/06/2019 0617  NA 140 139 145  K 3.8 3.6 2.4*  CL 103 103 104  CO2 27 26 32  GLUCOSE 102* 84 105*  BUN 37* 36* 41*  CREATININE 1.00 1.08 1.54*  CALCIUM 8.2* 7.8* 7.7*  MG 2.3  --  1.7  PHOS 3.6  --  4.3    ABG: No results for input(s): PHART, PCO2ART, PO2ART, HCO3, O2SAT in the last 168 hours.  Liver Function Tests: Recent Labs  Lab 07/10/19 0522 08/06/2019 0617  ALBUMIN 2.2* 1.5*   No results for input(s): LIPASE, AMYLASE in the last 168 hours. Recent Labs  Lab 07/12/19 1012  AMMONIA 28    CBC: Recent Labs  Lab 07/10/19 0522 August 06, 2019 0617  WBC 8.1 11.3*  HGB 10.4* 10.7*  HCT 32.1* 33.2*  MCV 97.6 94.9  PLT 384 204    Cardiac Enzymes: No results for  input(s): CKTOTAL, CKMB, CKMBINDEX, TROPONINI in the last 168 hours.  BNP (last 3 results) No results for input(s): BNP in the last 8760 hours.  ProBNP (last 3 results) No results for input(s): PROBNP in the last 8760 hours.  Radiological Exams: DG CHEST PORT 1 VIEW  Result Date: 2019/08/06 CLINICAL DATA:  Pneumonia. EXAM: PORTABLE CHEST 1 VIEW COMPARISON:  Chest x-ray 07/12/2019. FINDINGS: Left IJ line and NG tube in stable position. Heart size stable. Diffuse severe bilateral pulmonary infiltrates with progression of infiltrates in the upper lungs. Interim improvement of left-sided pleural effusion. Small left pleural effusion remains. Small right pleural effusion noted. No pneumothorax. IMPRESSION: 1.  Left IJ line and NG tube in stable position. 2. Diffuse severe bilateral pulmonary infiltrates again noted. Progression of infiltrates in the upper lungs noted on today's exam. Interim improvement of left-sided pleural effusion. Small left pleural effusion remains. Small right pleural effusion also noted. Electronically Signed   By: Marcello Moores  Register   On: 08/06/19 07:48    Assessment/Plan Active Problems:   Acute on chronic respiratory failure with hypoxia (HCC)   Severe sepsis (HCC)   Alcoholic cirrhosis of liver (HCC)   Adrenal insufficiency (HCC)   Dementia (HCC)   Bilateral pleural effusion   1. Acute on chronic respiratory failure with hypoxia patient is showing no signs of improvement oxygenation is  getting worse renal function is also getting worse I did call the family spoke with the daughter as well as the wife and explained that he is not likely going to survive this illness.  They do understand they do not want him intubated or placed on life support or resuscitated.  What they would do is to like to have closure and would like to at least be able to see him before they withdraw the BiPAP and keep him comfortable. 2. Severe sepsis no change 3. Alcohol cirrhosis no  change 4. Adrenal insufficiency at baseline 5. Dementia no change 6. Bilateral pleural effusions patient is at baseline with small effusions noted   I have personally seen and evaluated the patient, evaluated laboratory and imaging results, formulated the assessment and plan and placed orders. The Patient requires high complexity decision making with multiple systems involvement.  Time 35 minutes extended discussion with family Rounds were done with the Respiratory Therapy Director and Staff therapists and discussed with nursing staff also.  Yevonne Pax, MD [facility] Pulmonary Critical Care Medicine Sleep Medicine

## 2019-08-10 DEATH — deceased

## 2021-08-19 IMAGING — US US ABDOMEN LIMITED
1 series · 14 of 25 positions shown · non-contrast
Comparison: Abdominal radiograph dated 07/02/2019.

CLINICAL DATA: 87-year-old male with cirrhosis and increased LFT.

EXAM:
ULTRASOUND ABDOMEN LIMITED RIGHT UPPER QUADRANT

[Series 1: us abdomen limited · 14 of 35 slices shown]
[im 1/35]
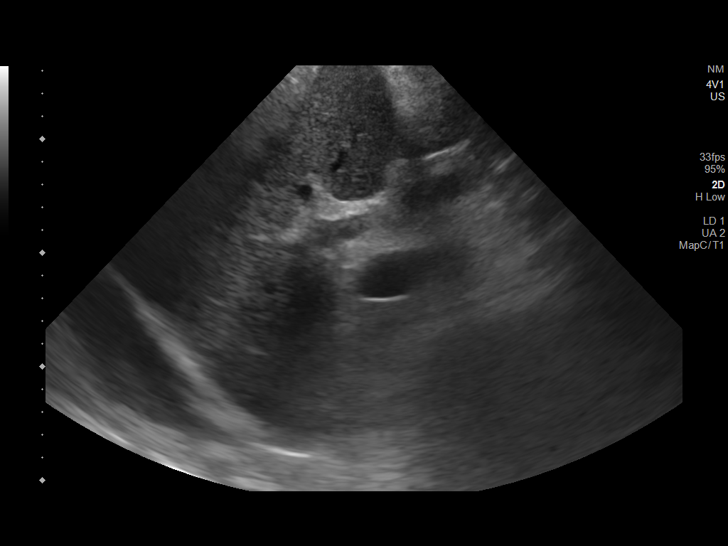
[im 3/35]
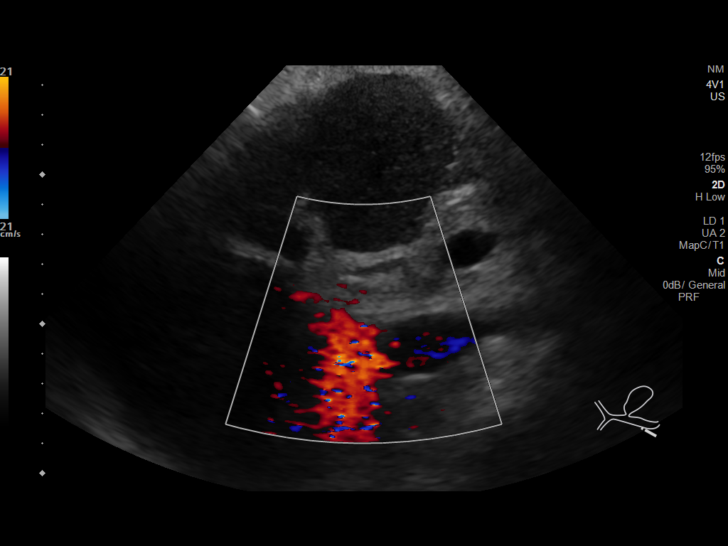
[im 6/35]
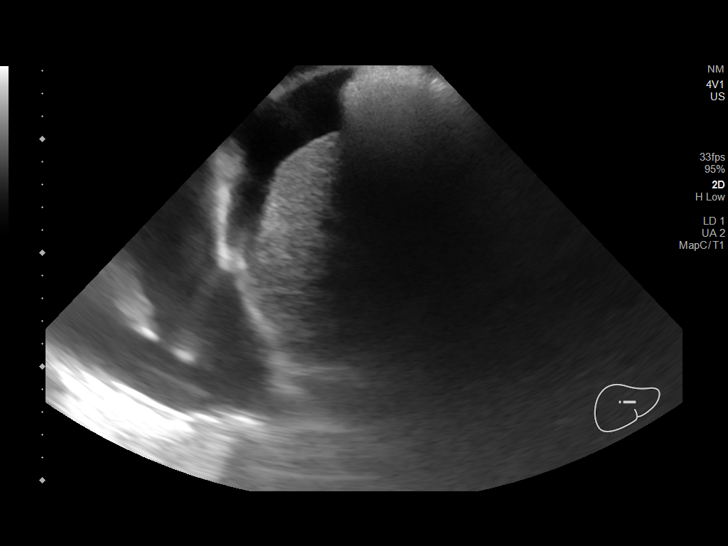
[im 9/35]
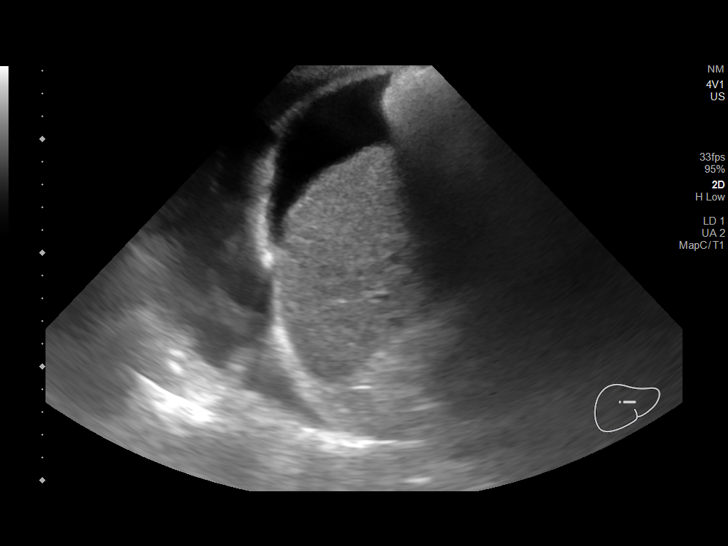
[im 12/35]
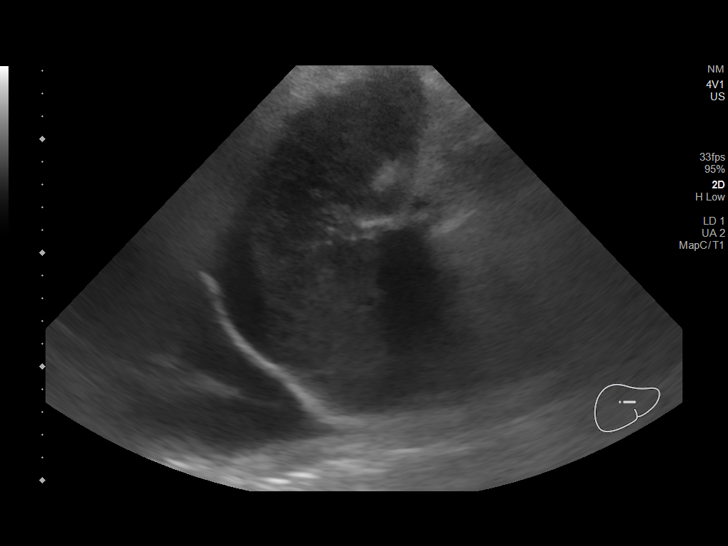
[im 13/35]
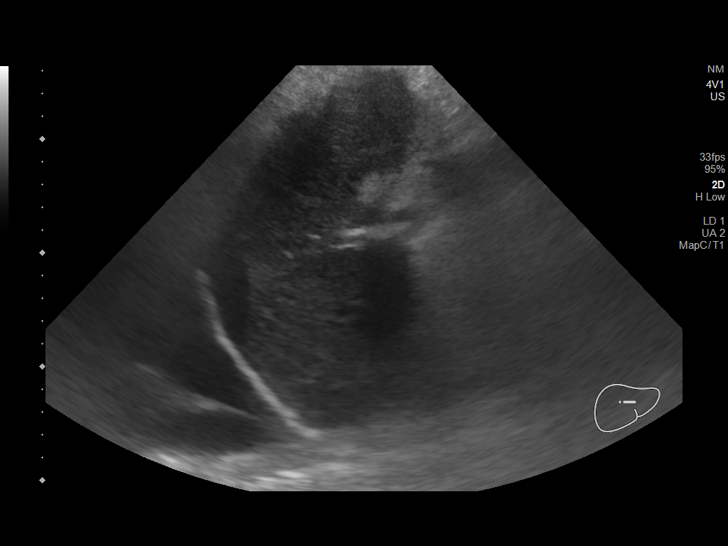
[im 16/35]
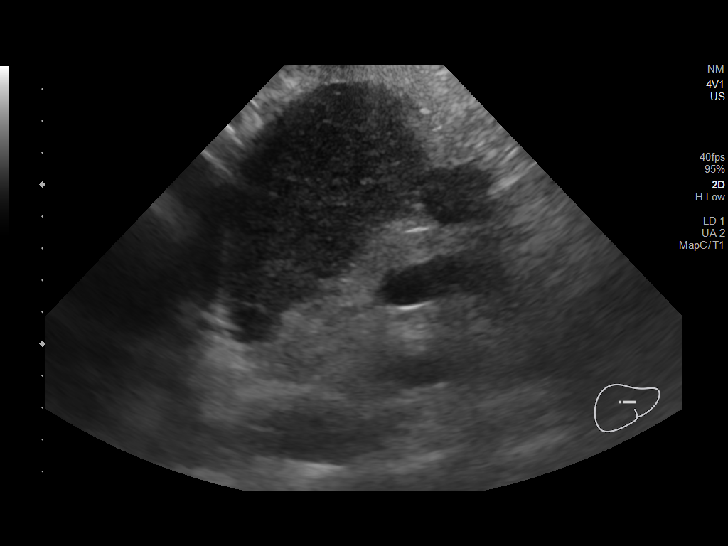
[im 19/35]
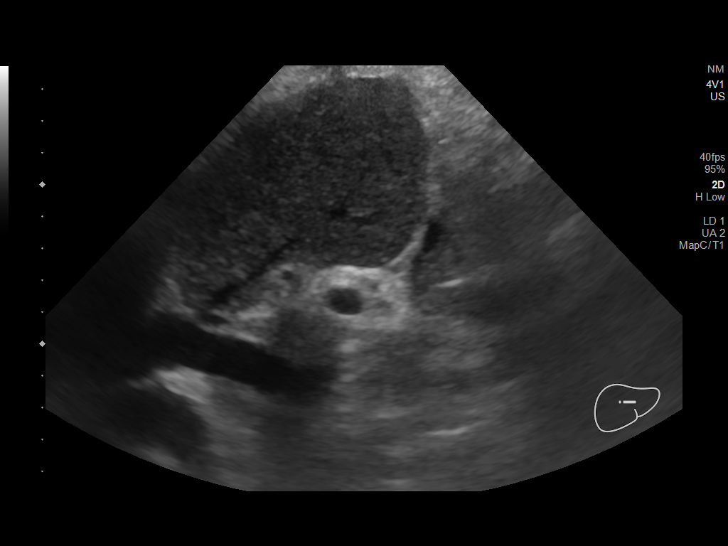
[im 22/35]
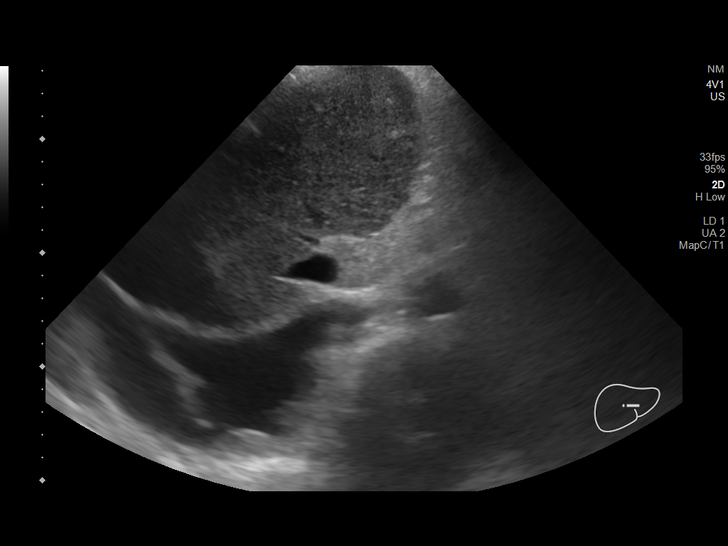
[im 23/35]
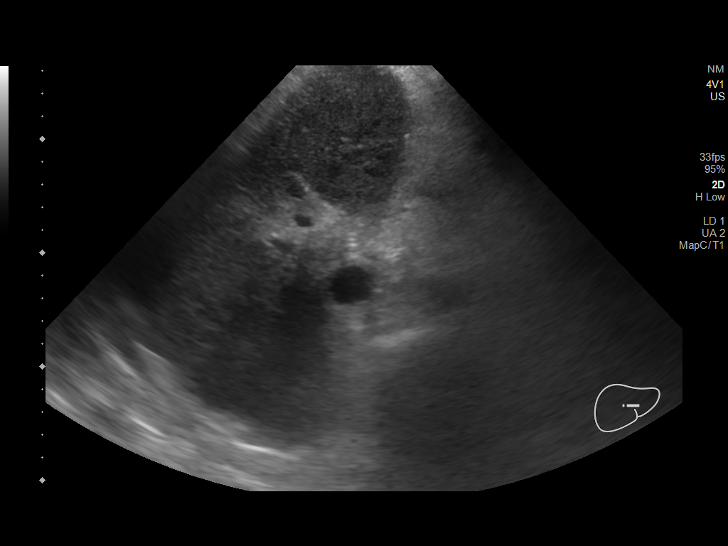
[im 26/35]
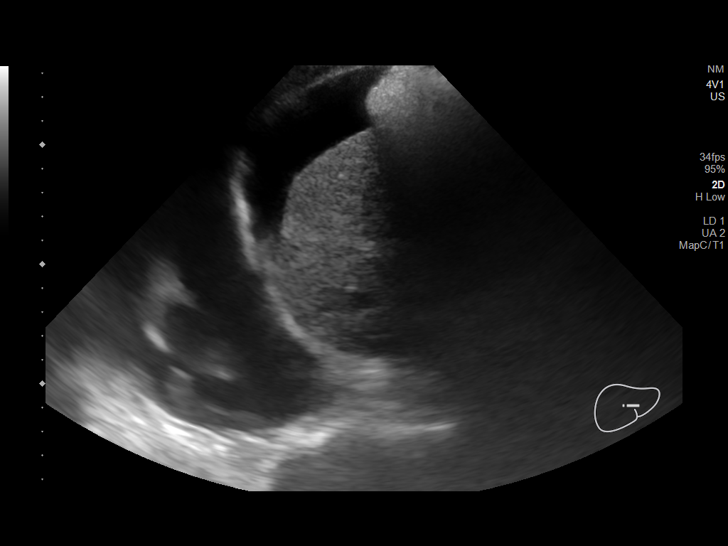
[im 29/35]
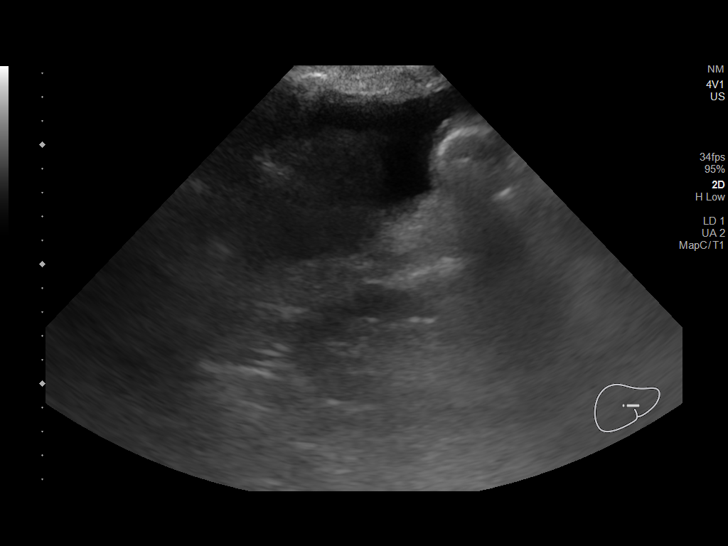
[im 32/35]
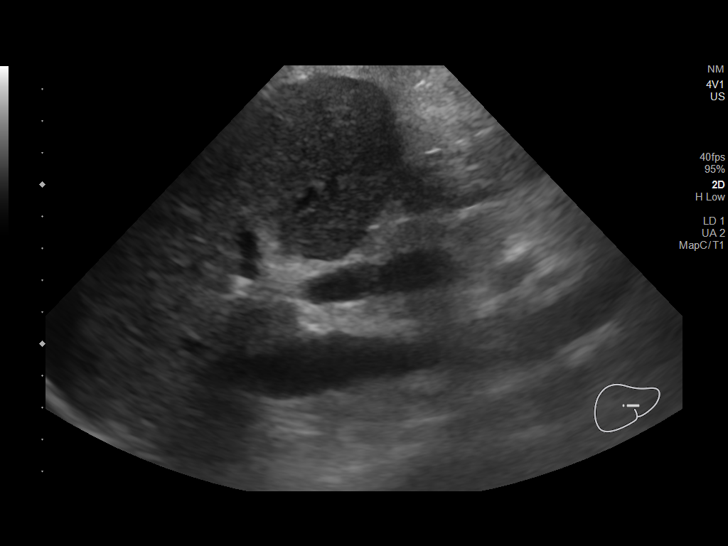
[im 35/35]
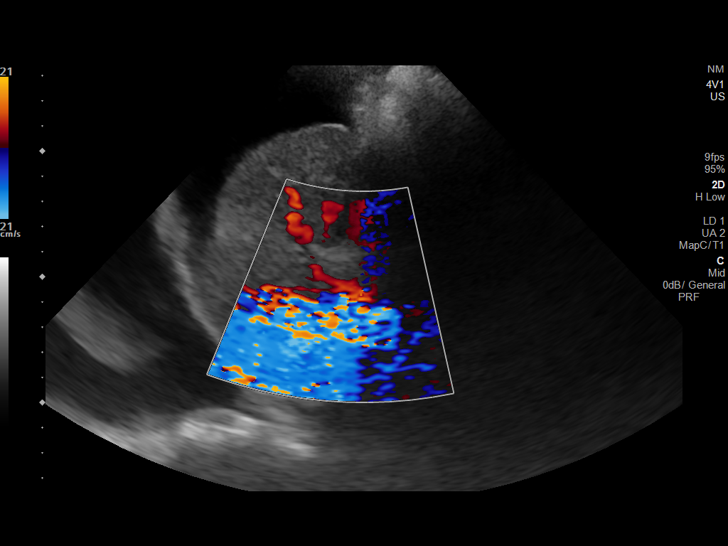

[14 of 25 positions shown; findings below may reference images not displayed]

FINDINGS: Gallbladder:

Not visualized.

Common bile duct:

Diameter: 4 mm

Liver:

Morphologic changes of cirrhosis. The main portal vein is
suboptimally visualized but appears patent with appropriate flow
direction.

Other: There is a small perihepatic ascites and partially visualized
small to moderate right pleural effusion.
IMPRESSION: 1. Cirrhosis
2. Small ascites and small to moderate right pleural effusion.

## 2021-08-24 IMAGING — US IR ABDOMEN US LIMITED
1 series · 4 of 4 positions shown · non-contrast
Comparison: None

CLINICAL DATA: History of alcoholic cirrhosis. Please perform
ascites search ultrasound ultrasound-guided paracentesis as
indicated.

EXAM:
LIMITED ABDOMEN ULTRASOUND FOR ASCITES
TECHNIQUE: Limited ultrasound survey for ascites was performed in all four
abdominal quadrants.

[Series 1: (id) · 4 of 4 slices shown]
[im 1/4]
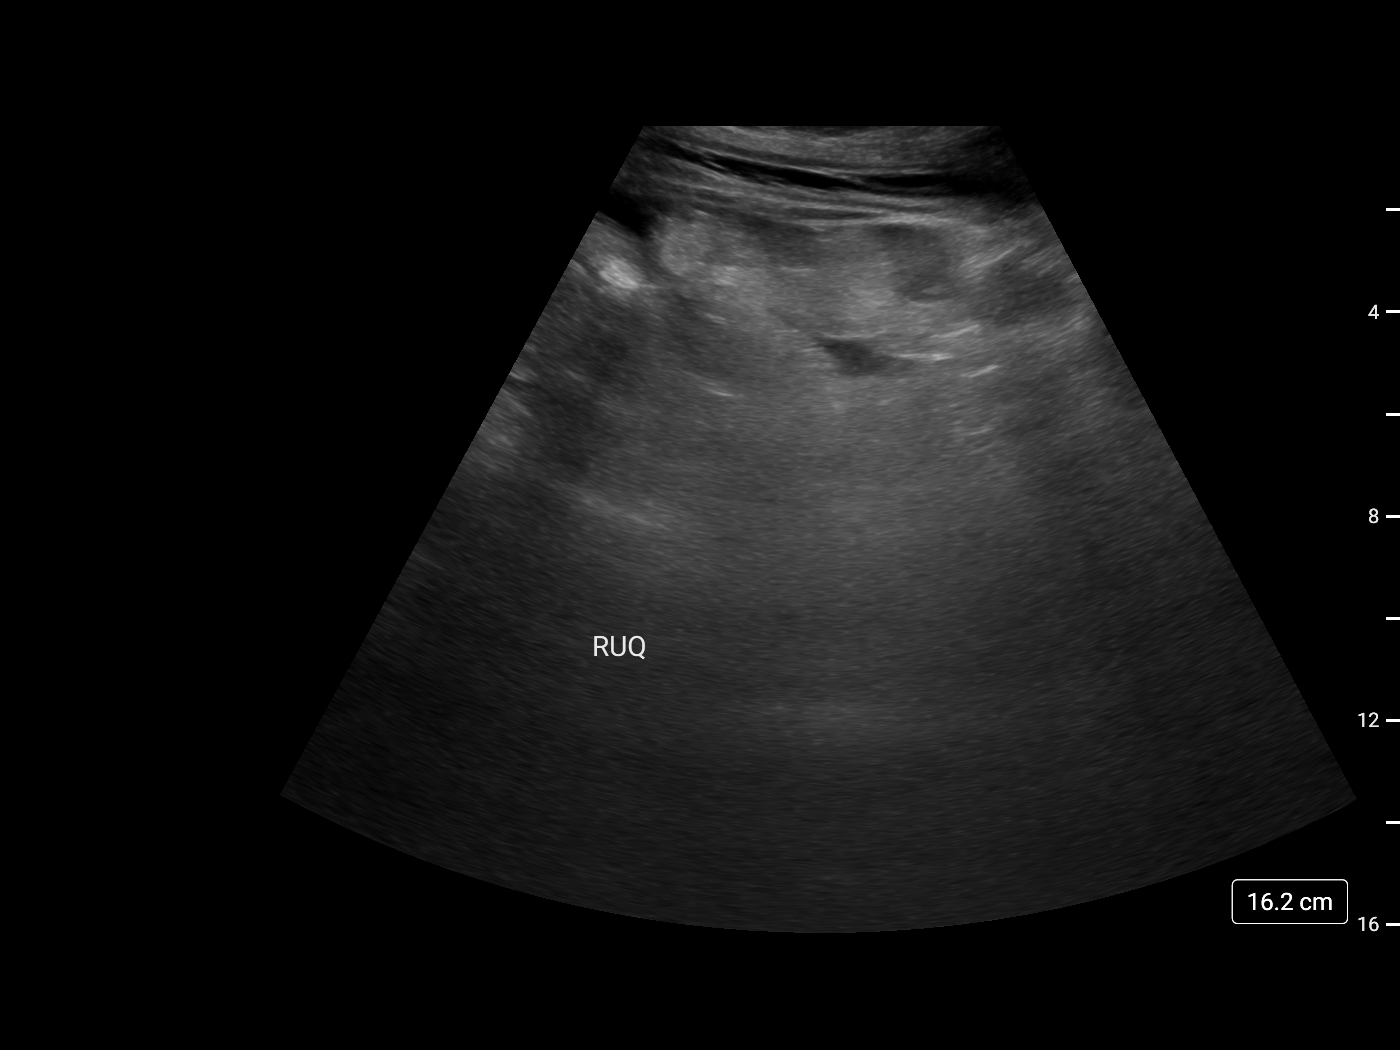
[im 2/4]
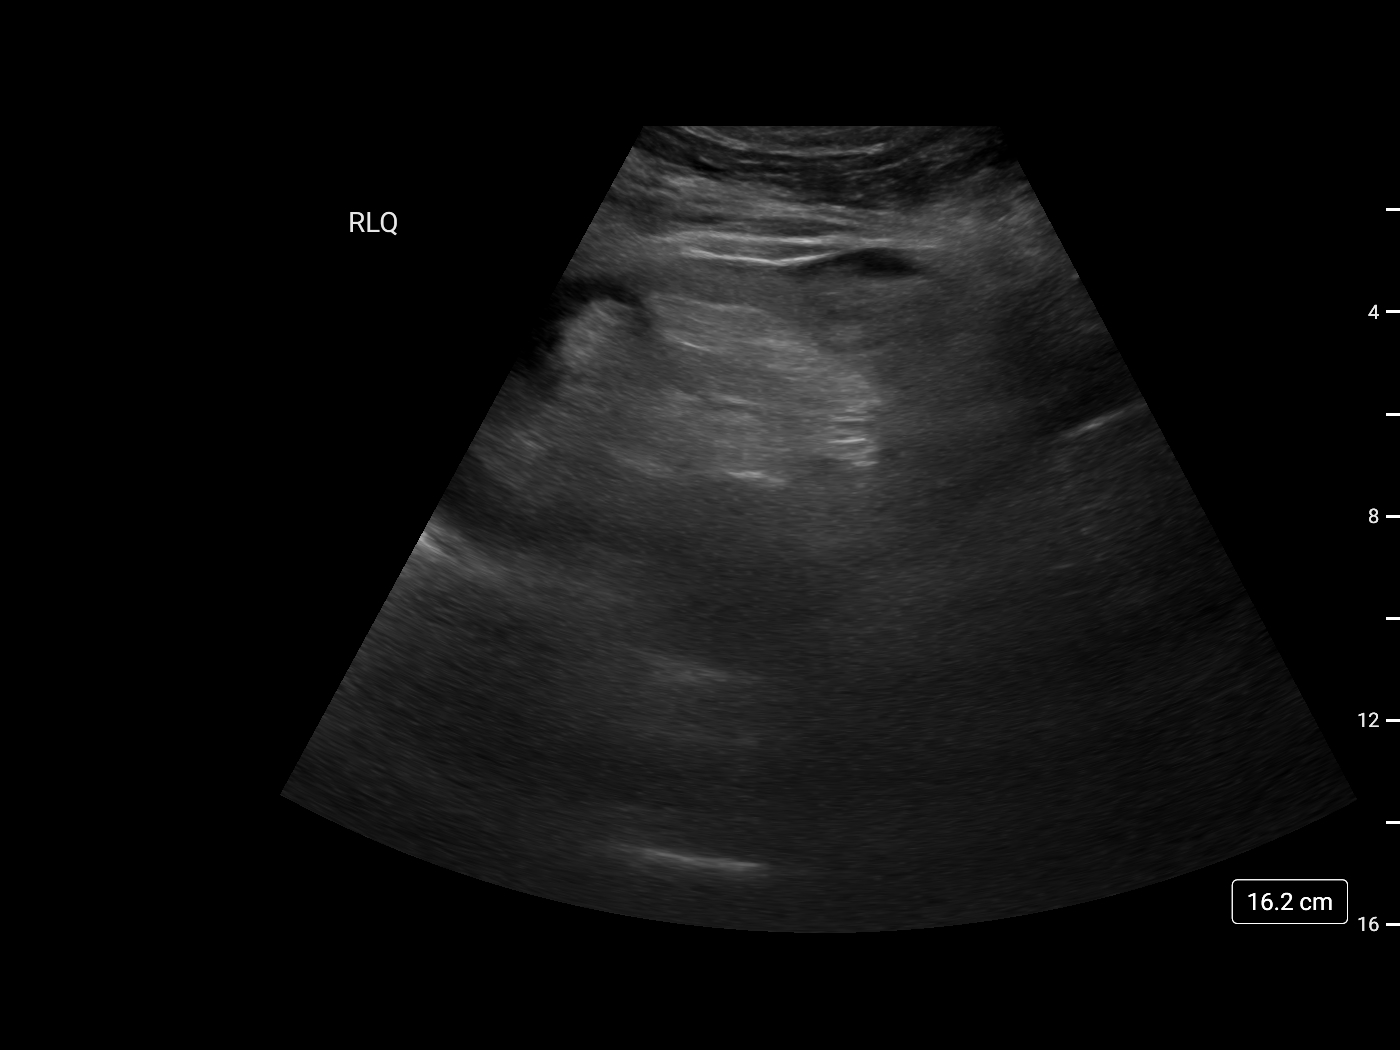
[im 3/4]
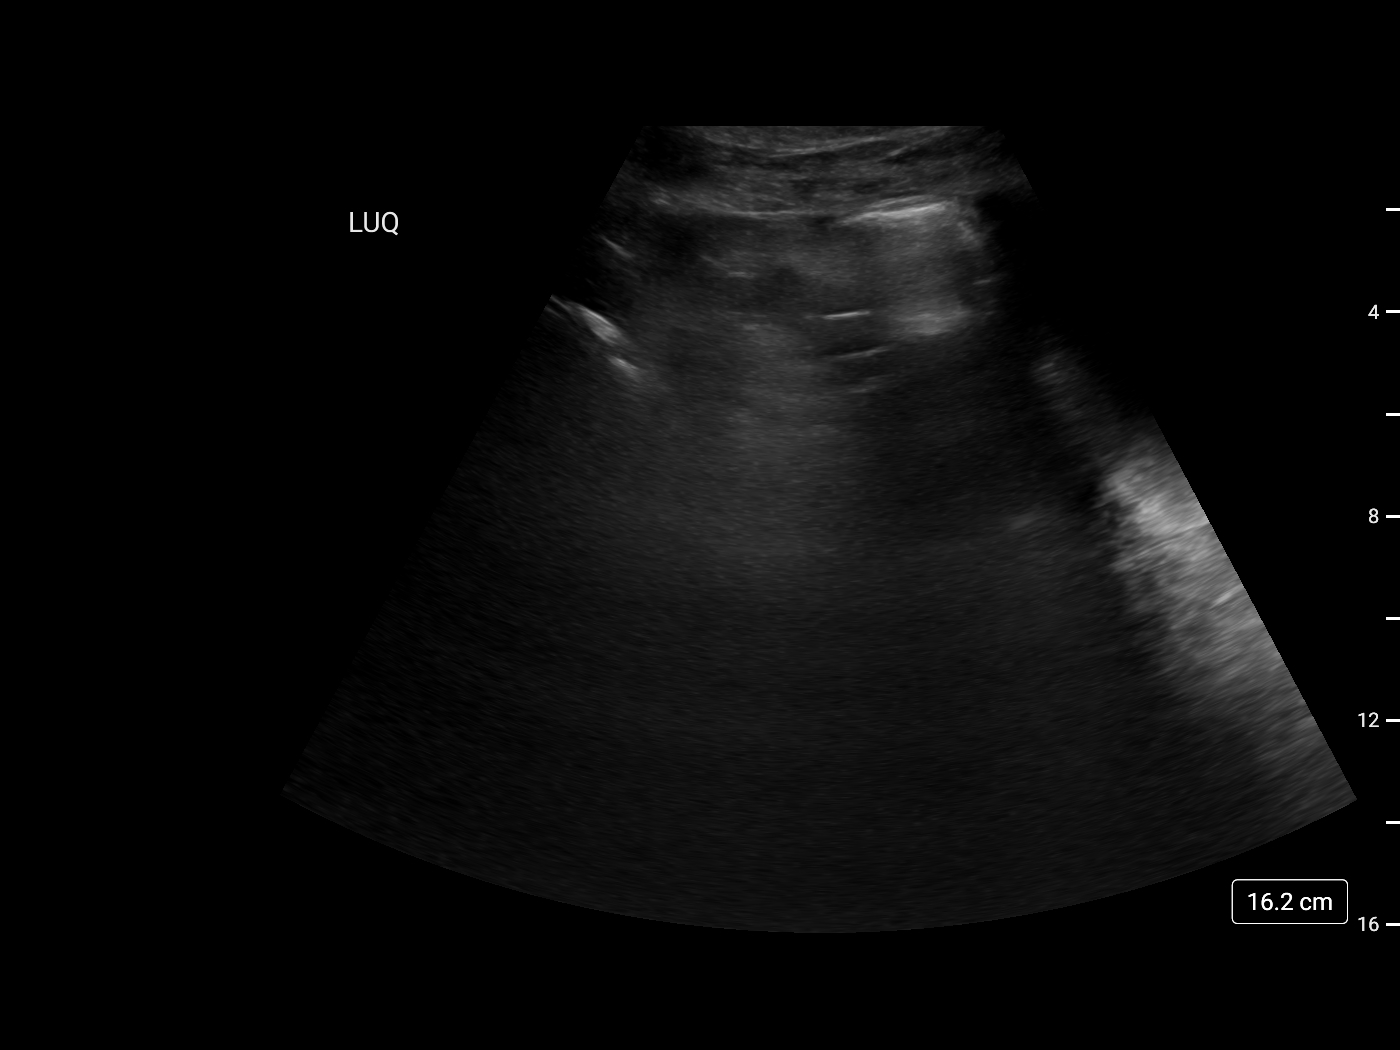
[im 4/4]
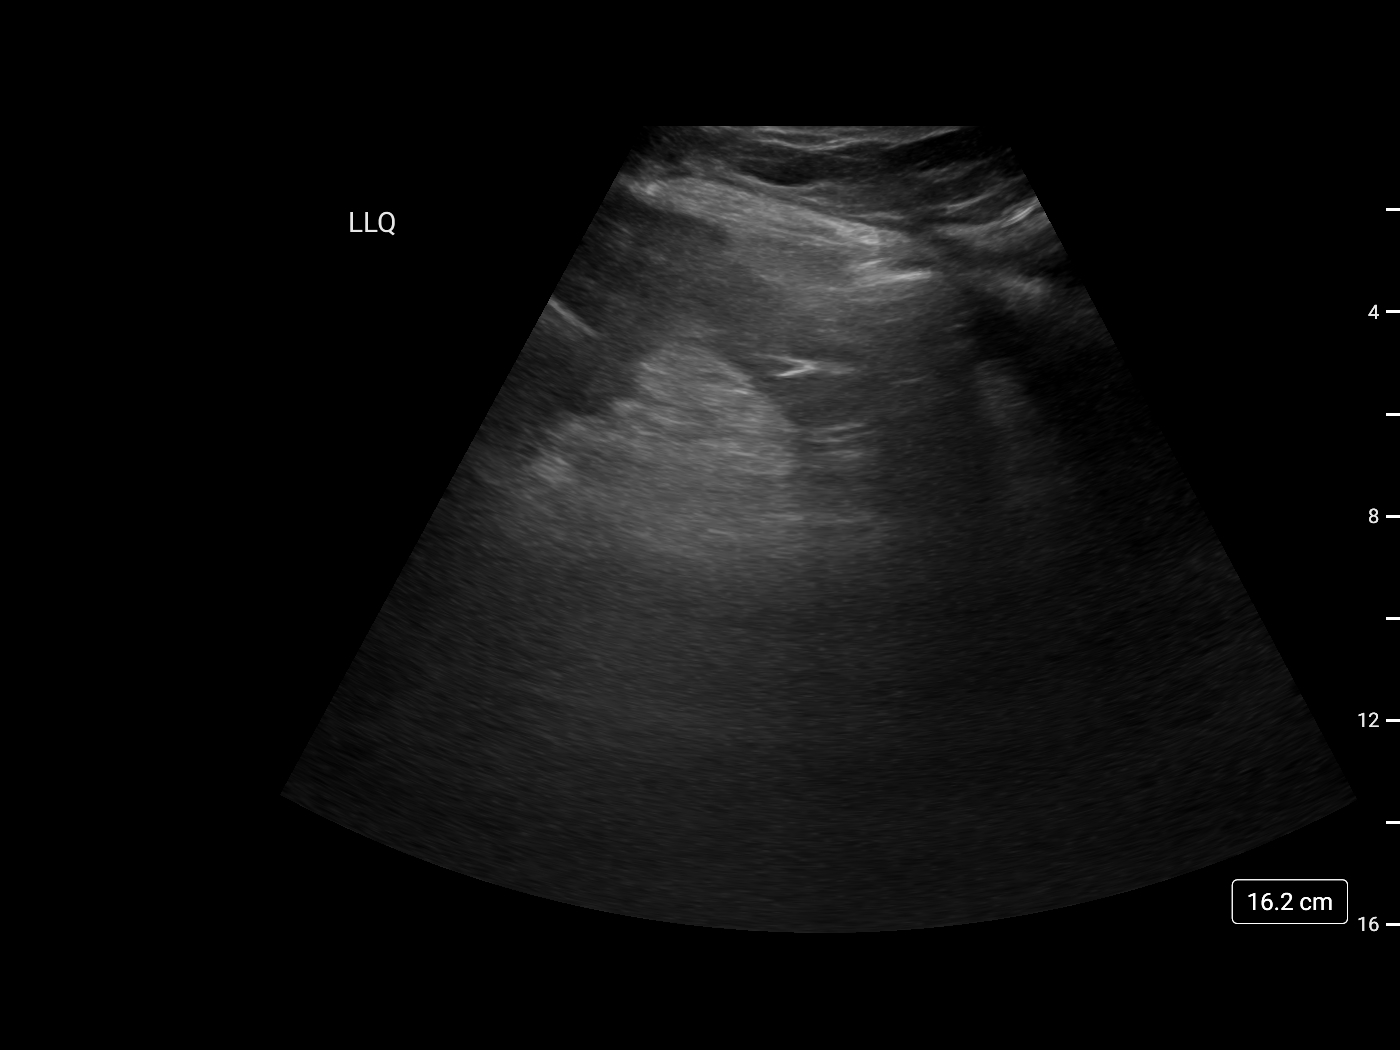

[4 of 4 positions shown; findings below may reference images not displayed]

FINDINGS: Sonographic evaluation the abdomen demonstrates a trace amount of
intra-abdominal ascites, too small to allow for safe
ultrasound-guided paracentesis. No paracentesis attempted.
IMPRESSION: Trace amount of intra-abdominal ascites, too small to allow for safe
ultrasound-guided paracentesis.

## 2021-08-31 IMAGING — DX DG CHEST 1V PORT
1 series · 1 of 1 positions shown · non-contrast
Comparison: Chest x-ray 07/12/2019.

CLINICAL DATA: Pneumonia.

EXAM:
PORTABLE CHEST 1 VIEW

[chest ap]
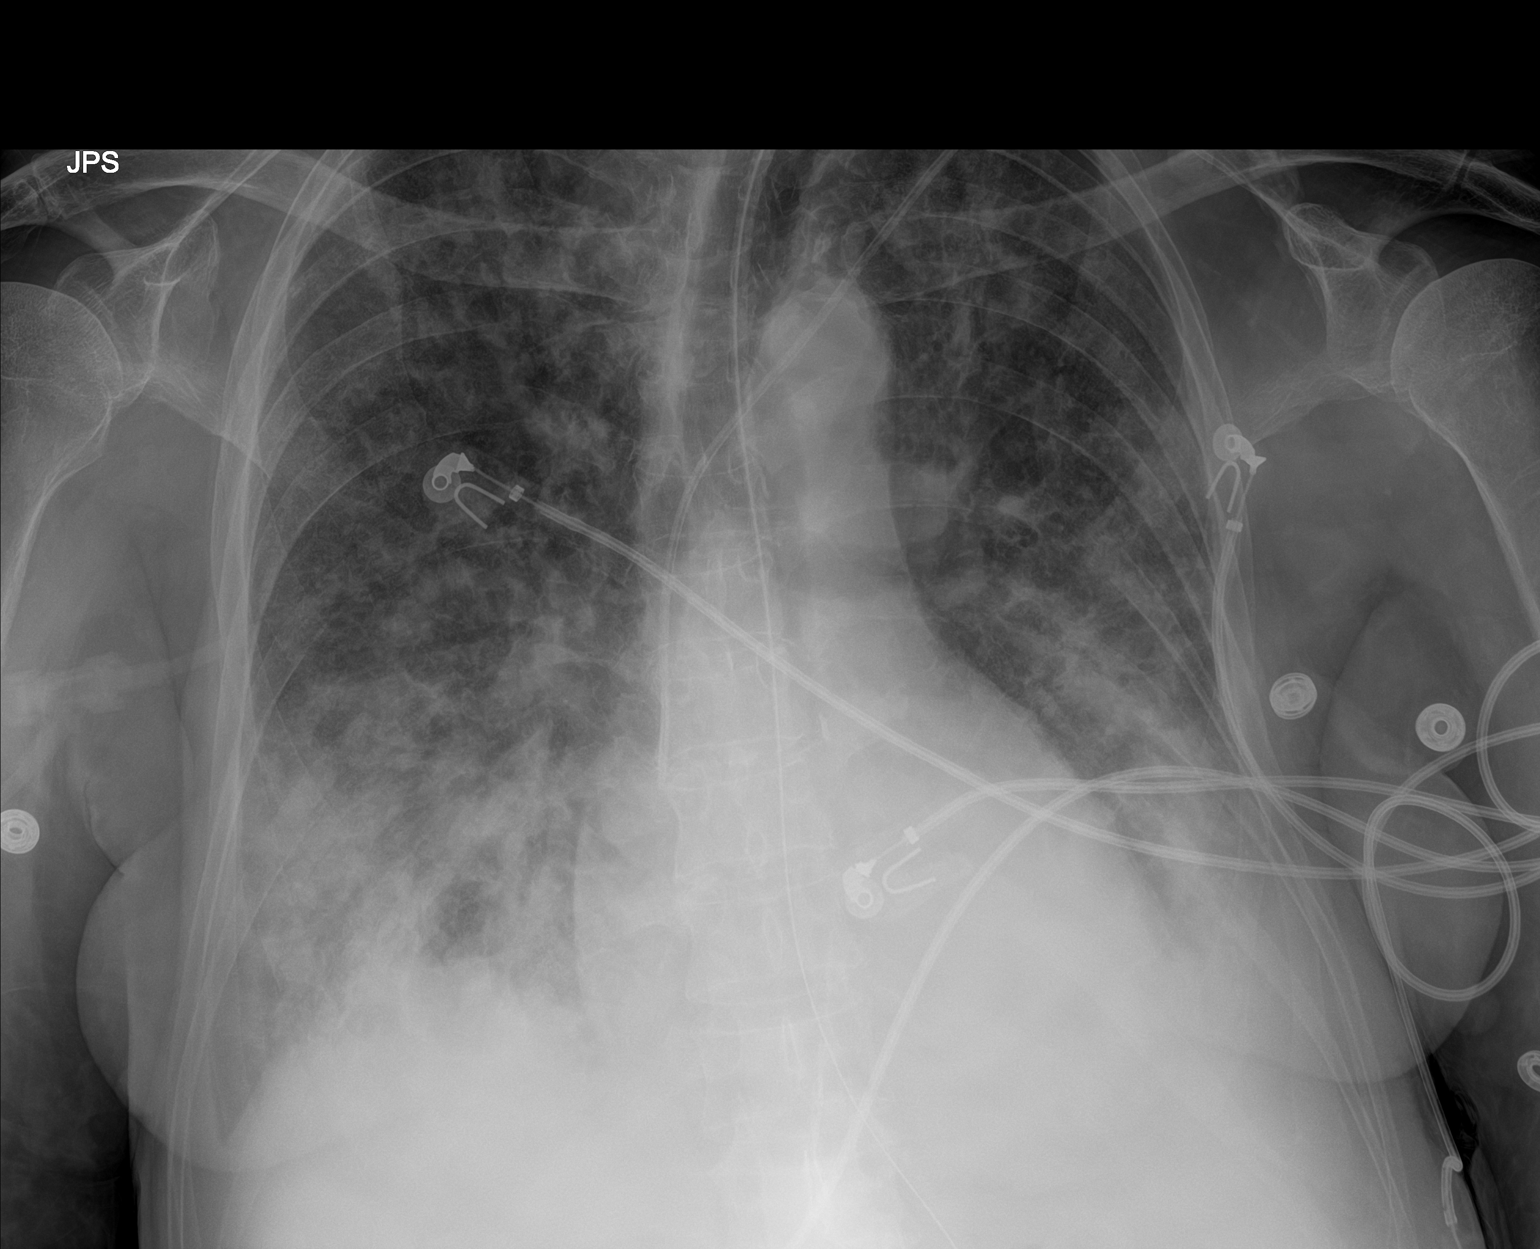

[1 of 1 positions shown; findings below may reference images not displayed]

FINDINGS: Left IJ line and NG tube in stable position. Heart size stable.
Diffuse severe bilateral pulmonary infiltrates with progression of
infiltrates in the upper lungs. Interim improvement of left-sided
pleural effusion. Small left pleural effusion remains. Small right
pleural effusion noted. No pneumothorax.
IMPRESSION: 1.  Left IJ line and NG tube in stable position.

2. Diffuse severe bilateral pulmonary infiltrates again noted.
Progression of infiltrates in the upper lungs noted on today's exam.
Interim improvement of left-sided pleural effusion. Small left
pleural effusion remains. Small right pleural effusion also noted.
